# Patient Record
Sex: Female | Born: 1980
Health system: Southern US, Community
[De-identification: ages and names within clinical notes are randomized; demographics above are authoritative.]

## PROBLEM LIST (undated history)

## (undated) DIAGNOSIS — F329 Major depressive disorder, single episode, unspecified: Secondary | ICD-10-CM

## (undated) DIAGNOSIS — F319 Bipolar disorder, unspecified: Secondary | ICD-10-CM

## (undated) DIAGNOSIS — F419 Anxiety disorder, unspecified: Secondary | ICD-10-CM

## (undated) DIAGNOSIS — F32A Depression, unspecified: Secondary | ICD-10-CM

## (undated) HISTORY — DX: Major depressive disorder, single episode, unspecified: F32.9

## (undated) HISTORY — PX: CHOLECYSTECTOMY: SHX55

## (undated) HISTORY — DX: Depression, unspecified: F32.A

## (undated) HISTORY — DX: Anxiety disorder, unspecified: F41.9

## (undated) HISTORY — DX: Bipolar disorder, unspecified: F31.9

---

## 2003-02-08 ENCOUNTER — Encounter: Admission: RE | Admit: 2003-02-08 | Discharge: 2003-02-08 | Payer: Self-pay | Admitting: Obstetrics and Gynecology

## 2003-02-20 ENCOUNTER — Inpatient Hospital Stay (HOSPITAL_COMMUNITY): Admission: AD | Admit: 2003-02-20 | Discharge: 2003-02-22 | Payer: Self-pay | Admitting: Obstetrics and Gynecology

## 2003-03-01 ENCOUNTER — Inpatient Hospital Stay (HOSPITAL_COMMUNITY): Admission: AD | Admit: 2003-03-01 | Discharge: 2003-03-15 | Payer: Self-pay | Admitting: Obstetrics and Gynecology

## 2003-03-12 ENCOUNTER — Encounter (INDEPENDENT_AMBULATORY_CARE_PROVIDER_SITE_OTHER): Payer: Self-pay | Admitting: Specialist

## 2003-04-07 ENCOUNTER — Other Ambulatory Visit: Admission: RE | Admit: 2003-04-07 | Discharge: 2003-04-07 | Payer: Self-pay | Admitting: Obstetrics and Gynecology

## 2005-11-17 ENCOUNTER — Encounter: Admission: RE | Admit: 2005-11-17 | Discharge: 2005-11-17 | Payer: Self-pay | Admitting: Internal Medicine

## 2005-11-24 ENCOUNTER — Ambulatory Visit (HOSPITAL_COMMUNITY): Payer: Self-pay | Admitting: Psychiatry

## 2005-12-17 ENCOUNTER — Ambulatory Visit (HOSPITAL_COMMUNITY): Payer: Self-pay | Admitting: Psychiatry

## 2005-12-31 ENCOUNTER — Ambulatory Visit (HOSPITAL_COMMUNITY): Payer: Self-pay | Admitting: Psychiatry

## 2006-01-28 ENCOUNTER — Ambulatory Visit (HOSPITAL_COMMUNITY): Payer: Self-pay | Admitting: Psychiatry

## 2006-04-13 ENCOUNTER — Ambulatory Visit (HOSPITAL_COMMUNITY): Payer: Self-pay | Admitting: Psychiatry

## 2006-08-26 ENCOUNTER — Ambulatory Visit (HOSPITAL_COMMUNITY): Payer: Self-pay | Admitting: Psychiatry

## 2006-10-28 ENCOUNTER — Ambulatory Visit (HOSPITAL_COMMUNITY): Payer: Self-pay | Admitting: Psychiatry

## 2006-12-23 ENCOUNTER — Encounter: Admission: RE | Admit: 2006-12-23 | Discharge: 2007-03-23 | Payer: Self-pay | Admitting: Internal Medicine

## 2006-12-28 ENCOUNTER — Ambulatory Visit (HOSPITAL_COMMUNITY): Payer: Self-pay | Admitting: Psychiatry

## 2007-02-25 HISTORY — PX: LAPAROSCOPIC GASTRIC BANDING: SHX1100

## 2007-03-01 ENCOUNTER — Ambulatory Visit (HOSPITAL_COMMUNITY): Payer: Self-pay | Admitting: Psychiatry

## 2007-03-12 ENCOUNTER — Ambulatory Visit (HOSPITAL_COMMUNITY): Admission: RE | Admit: 2007-03-12 | Discharge: 2007-03-12 | Payer: Self-pay | Admitting: General Surgery

## 2007-05-03 ENCOUNTER — Ambulatory Visit (HOSPITAL_COMMUNITY): Payer: Self-pay | Admitting: Psychiatry

## 2007-06-16 ENCOUNTER — Encounter: Admission: RE | Admit: 2007-06-16 | Discharge: 2007-09-14 | Payer: Self-pay | Admitting: Internal Medicine

## 2007-07-05 ENCOUNTER — Ambulatory Visit (HOSPITAL_COMMUNITY): Admission: RE | Admit: 2007-07-05 | Discharge: 2007-07-06 | Payer: Self-pay | Admitting: General Surgery

## 2007-07-14 ENCOUNTER — Ambulatory Visit (HOSPITAL_COMMUNITY): Payer: Self-pay | Admitting: Psychiatry

## 2007-09-20 ENCOUNTER — Encounter: Admission: RE | Admit: 2007-09-20 | Discharge: 2007-11-16 | Payer: Self-pay | Admitting: Internal Medicine

## 2007-10-08 ENCOUNTER — Ambulatory Visit (HOSPITAL_COMMUNITY): Payer: Self-pay | Admitting: Psychiatry

## 2007-12-06 ENCOUNTER — Ambulatory Visit (HOSPITAL_COMMUNITY): Payer: Self-pay | Admitting: Psychiatry

## 2008-04-10 ENCOUNTER — Ambulatory Visit (HOSPITAL_COMMUNITY): Payer: Self-pay | Admitting: Psychiatry

## 2008-08-14 ENCOUNTER — Ambulatory Visit (HOSPITAL_COMMUNITY): Payer: Self-pay | Admitting: Psychiatry

## 2008-11-15 ENCOUNTER — Ambulatory Visit (HOSPITAL_COMMUNITY): Payer: Self-pay | Admitting: Psychiatry

## 2009-02-07 ENCOUNTER — Ambulatory Visit (HOSPITAL_COMMUNITY): Payer: Self-pay | Admitting: Psychiatry

## 2009-02-24 HISTORY — PX: OTHER SURGICAL HISTORY: SHX169

## 2009-05-23 ENCOUNTER — Ambulatory Visit (HOSPITAL_COMMUNITY): Payer: Self-pay | Admitting: Psychiatry

## 2009-06-24 ENCOUNTER — Emergency Department (HOSPITAL_BASED_OUTPATIENT_CLINIC_OR_DEPARTMENT_OTHER): Admission: EM | Admit: 2009-06-24 | Discharge: 2009-06-24 | Payer: Self-pay | Admitting: Emergency Medicine

## 2009-06-24 ENCOUNTER — Ambulatory Visit: Payer: Self-pay | Admitting: Diagnostic Radiology

## 2009-06-27 ENCOUNTER — Inpatient Hospital Stay (HOSPITAL_COMMUNITY): Admission: EM | Admit: 2009-06-27 | Discharge: 2009-07-01 | Payer: Self-pay | Admitting: Emergency Medicine

## 2009-08-08 ENCOUNTER — Encounter: Admission: RE | Admit: 2009-08-08 | Discharge: 2009-08-08 | Payer: Self-pay | Admitting: General Surgery

## 2009-08-08 ENCOUNTER — Ambulatory Visit (HOSPITAL_COMMUNITY): Payer: Self-pay | Admitting: Psychiatry

## 2009-08-21 ENCOUNTER — Inpatient Hospital Stay (HOSPITAL_COMMUNITY): Admission: RE | Admit: 2009-08-21 | Discharge: 2009-08-23 | Payer: Self-pay | Admitting: Surgery

## 2009-11-05 ENCOUNTER — Ambulatory Visit (HOSPITAL_COMMUNITY): Payer: Self-pay | Admitting: Psychiatry

## 2010-02-24 HISTORY — PX: GASTRIC BYPASS: SHX52

## 2010-03-25 ENCOUNTER — Ambulatory Visit (HOSPITAL_COMMUNITY)
Admission: RE | Admit: 2010-03-25 | Discharge: 2010-03-25 | Payer: Self-pay | Source: Home / Self Care | Attending: Psychiatry | Admitting: Psychiatry

## 2010-05-12 LAB — CBC
HCT: 35.2 % — ABNORMAL LOW (ref 36.0–46.0)
HCT: 36.6 % (ref 36.0–46.0)
Hemoglobin: 12 g/dL (ref 12.0–15.0)
Hemoglobin: 12.7 g/dL (ref 12.0–15.0)
MCH: 31.1 pg (ref 26.0–34.0)
MCH: 31.5 pg (ref 26.0–34.0)
MCHC: 34.1 g/dL (ref 30.0–36.0)
MCHC: 34.7 g/dL (ref 30.0–36.0)
MCV: 90.8 fL (ref 78.0–100.0)
MCV: 91 fL (ref 78.0–100.0)
Platelets: 259 10*3/uL (ref 150–400)
Platelets: 301 10*3/uL (ref 150–400)
RBC: 3.87 MIL/uL (ref 3.87–5.11)
RBC: 4.04 MIL/uL (ref 3.87–5.11)
RDW: 12.8 % (ref 11.5–15.5)
RDW: 13.1 % (ref 11.5–15.5)
WBC: 5.1 10*3/uL (ref 4.0–10.5)
WBC: 7 10*3/uL (ref 4.0–10.5)

## 2010-05-12 LAB — COMPREHENSIVE METABOLIC PANEL
ALT: 14 U/L (ref 0–35)
AST: 16 U/L (ref 0–37)
Albumin: 3.5 g/dL (ref 3.5–5.2)
Alkaline Phosphatase: 73 U/L (ref 39–117)
BUN: 8 mg/dL (ref 6–23)
CO2: 26 mEq/L (ref 19–32)
Calcium: 8.8 mg/dL (ref 8.4–10.5)
Chloride: 104 mEq/L (ref 96–112)
Creatinine, Ser: 0.74 mg/dL (ref 0.4–1.2)
GFR calc Af Amer: >60 mL/min (ref >60)
GFR calc non Af Amer: >60 mL/min (ref >60)
Glucose, Bld: 86 mg/dL (ref 70–99)
Potassium: 3.9 mEq/L (ref 3.5–5.1)
Sodium: 138 mEq/L (ref 135–145)
Total Bilirubin: 0.6 mg/dL (ref 0.3–1.2)
Total Protein: 6.7 g/dL (ref 6.0–8.3)

## 2010-05-12 LAB — DIFFERENTIAL
Basophils Absolute: 0 10*3/uL (ref 0.0–0.1)
Basophils Relative: 0 % (ref 0–1)
Eosinophils Absolute: 0.1 10*3/uL (ref 0.0–0.7)
Eosinophils Relative: 1 % (ref 0–5)
Lymphocytes Relative: 31 % (ref 12–46)
Lymphs Abs: 1.6 10*3/uL (ref 0.7–4.0)
Monocytes Absolute: 0.5 10*3/uL (ref 0.1–1.0)
Monocytes Relative: 9 % (ref 3–12)
Neutro Abs: 2.9 10*3/uL (ref 1.7–7.7)
Neutrophils Relative %: 58 % (ref 43–77)

## 2010-05-14 LAB — DIFFERENTIAL
Basophils Absolute: 0 10*3/uL (ref 0.0–0.1)
Basophils Absolute: 0 10*3/uL (ref 0.0–0.1)
Basophils Relative: 0 % (ref 0–1)
Eosinophils Absolute: 0 10*3/uL (ref 0.0–0.7)
Eosinophils Absolute: 0 10*3/uL (ref 0.0–0.7)
Eosinophils Relative: 0 % (ref 0–5)
Eosinophils Relative: 0 % (ref 0–5)
Lymphocytes Relative: 15 % (ref 12–46)
Lymphs Abs: 1.5 10*3/uL (ref 0.7–4.0)
Monocytes Absolute: 0.6 10*3/uL (ref 0.1–1.0)
Monocytes Absolute: 0.7 10*3/uL (ref 0.1–1.0)
Monocytes Relative: 6 % (ref 3–12)
Neutro Abs: 8 10*3/uL — ABNORMAL HIGH (ref 1.7–7.7)
Neutrophils Relative %: 79 % — ABNORMAL HIGH (ref 43–77)

## 2010-05-14 LAB — CBC
HCT: 31.5 % — ABNORMAL LOW (ref 36.0–46.0)
HCT: 37.4 % (ref 36.0–46.0)
HCT: 39.9 % (ref 36.0–46.0)
Hemoglobin: 11 g/dL — ABNORMAL LOW (ref 12.0–15.0)
Hemoglobin: 12.7 g/dL (ref 12.0–15.0)
Hemoglobin: 13.7 g/dL (ref 12.0–15.0)
MCHC: 34 g/dL (ref 30.0–36.0)
MCHC: 34.8 g/dL (ref 30.0–36.0)
MCHC: 34.4 g/dL (ref 30.0–36.0)
MCV: 90.5 fL (ref 78.0–100.0)
MCV: 91.3 fL (ref 78.0–100.0)
MCV: 89.1 fL (ref 78.0–100.0)
Platelets: 274 10*3/uL (ref 150–400)
Platelets: 327 10*3/uL (ref 150–400)
Platelets: 269 10*3/uL (ref 150–400)
RBC: 3.48 MIL/uL — ABNORMAL LOW (ref 3.87–5.11)
RBC: 4.1 MIL/uL (ref 3.87–5.11)
RDW: 12.6 % (ref 11.5–15.5)
RDW: 12.7 % (ref 11.5–15.5)
RDW: 12.1 % (ref 11.5–15.5)
WBC: 10.1 10*3/uL (ref 4.0–10.5)
WBC: 7.2 10*3/uL (ref 4.0–10.5)

## 2010-05-14 LAB — COMPREHENSIVE METABOLIC PANEL
ALT: 9 U/L (ref 0–35)
AST: 11 U/L (ref 0–37)
Albumin: 3.2 g/dL — ABNORMAL LOW (ref 3.5–5.2)
Alkaline Phosphatase: 89 U/L (ref 39–117)
BUN: 7 mg/dL (ref 6–23)
CO2: 26 mEq/L (ref 19–32)
Calcium: 9.2 mg/dL (ref 8.4–10.5)
Chloride: 101 mEq/L (ref 96–112)
Creatinine, Ser: 0.63 mg/dL (ref 0.4–1.2)
GFR calc Af Amer: >60 mL/min (ref >60)
GFR calc non Af Amer: >60 mL/min (ref >60)
Glucose, Bld: 90 mg/dL (ref 70–99)
Potassium: 3.5 mEq/L (ref 3.5–5.1)
Sodium: 136 mEq/L (ref 135–145)
Total Bilirubin: 0.4 mg/dL (ref 0.3–1.2)
Total Protein: 7.2 g/dL (ref 6.0–8.3)

## 2010-05-14 LAB — ANAEROBIC CULTURE

## 2010-05-14 LAB — BASIC METABOLIC PANEL
BUN: 9 mg/dL (ref 6–23)
CO2: 25 mEq/L (ref 19–32)
Chloride: 104 mEq/L (ref 96–112)
Glucose, Bld: 93 mg/dL (ref 70–99)
Potassium: 4.5 mEq/L (ref 3.5–5.1)
Sodium: 141 mEq/L (ref 135–145)

## 2010-05-14 LAB — VANCOMYCIN, TROUGH: Vancomycin Tr: 10.7 ug/mL (ref 10.0–20.0)

## 2010-05-14 LAB — CULTURE, ROUTINE-ABSCESS: Culture: NO GROWTH

## 2010-05-14 LAB — URINALYSIS, ROUTINE W REFLEX MICROSCOPIC
Bilirubin Urine: NEGATIVE
Glucose, UA: NEGATIVE mg/dL
Hgb urine dipstick: NEGATIVE
Protein, ur: NEGATIVE mg/dL
Urobilinogen, UA: 1 mg/dL (ref 0.0–1.0)

## 2010-05-14 LAB — CREATININE, SERUM
Creatinine, Ser: 0.49 mg/dL (ref 0.4–1.2)
GFR calc Af Amer: >60 mL/min (ref >60)
GFR calc non Af Amer: >60 mL/min (ref >60)

## 2010-05-14 LAB — URINE MICROSCOPIC-ADD ON

## 2010-05-14 LAB — D-DIMER, QUANTITATIVE: D-Dimer, Quant: 0.66 ug/mL-FEU — ABNORMAL HIGH (ref 0.00–0.48)

## 2010-05-22 ENCOUNTER — Other Ambulatory Visit (HOSPITAL_COMMUNITY): Payer: Self-pay | Admitting: General Surgery

## 2010-06-07 ENCOUNTER — Encounter (HOSPITAL_COMMUNITY): Payer: Managed Care, Other (non HMO) | Admitting: Psychiatry

## 2010-06-07 DIAGNOSIS — F3189 Other bipolar disorder: Secondary | ICD-10-CM

## 2010-06-10 ENCOUNTER — Ambulatory Visit (HOSPITAL_COMMUNITY)
Admission: RE | Admit: 2010-06-10 | Discharge: 2010-06-10 | Disposition: A | Discharge status: Home / Self Care | Payer: Managed Care, Other (non HMO) | Source: Ambulatory Visit | Attending: General Surgery | Admitting: General Surgery

## 2010-06-10 DIAGNOSIS — K8020 Calculus of gallbladder without cholecystitis without obstruction: Secondary | ICD-10-CM | POA: Insufficient documentation

## 2010-06-10 DIAGNOSIS — Z01818 Encounter for other preprocedural examination: Secondary | ICD-10-CM | POA: Insufficient documentation

## 2010-06-11 ENCOUNTER — Ambulatory Visit: Payer: Managed Care, Other (non HMO) | Admitting: *Deleted

## 2010-06-11 ENCOUNTER — Ambulatory Visit (HOSPITAL_COMMUNITY)
Admission: RE | Admit: 2010-06-11 | Discharge: 2010-06-11 | Disposition: A | Discharge status: Home / Self Care | Payer: Managed Care, Other (non HMO) | Source: Ambulatory Visit | Attending: General Surgery | Admitting: General Surgery

## 2010-06-12 ENCOUNTER — Encounter: Payer: Managed Care, Other (non HMO) | Attending: General Surgery | Admitting: *Deleted

## 2010-06-12 DIAGNOSIS — Z09 Encounter for follow-up examination after completed treatment for conditions other than malignant neoplasm: Secondary | ICD-10-CM | POA: Insufficient documentation

## 2010-06-12 DIAGNOSIS — Z9884 Bariatric surgery status: Secondary | ICD-10-CM | POA: Insufficient documentation

## 2010-06-12 DIAGNOSIS — Z713 Dietary counseling and surveillance: Secondary | ICD-10-CM | POA: Insufficient documentation

## 2010-06-17 ENCOUNTER — Encounter (HOSPITAL_COMMUNITY): Payer: Self-pay | Admitting: Psychiatry

## 2010-06-27 ENCOUNTER — Encounter: Payer: Managed Care, Other (non HMO) | Attending: General Surgery

## 2010-06-27 DIAGNOSIS — Z09 Encounter for follow-up examination after completed treatment for conditions other than malignant neoplasm: Secondary | ICD-10-CM | POA: Insufficient documentation

## 2010-06-27 DIAGNOSIS — Z713 Dietary counseling and surveillance: Secondary | ICD-10-CM | POA: Insufficient documentation

## 2010-06-27 DIAGNOSIS — Z9884 Bariatric surgery status: Secondary | ICD-10-CM | POA: Insufficient documentation

## 2010-07-05 ENCOUNTER — Other Ambulatory Visit: Payer: Self-pay | Admitting: General Surgery

## 2010-07-05 ENCOUNTER — Encounter (HOSPITAL_COMMUNITY): Payer: Managed Care, Other (non HMO) | Attending: General Surgery

## 2010-07-05 DIAGNOSIS — Z01811 Encounter for preprocedural respiratory examination: Secondary | ICD-10-CM | POA: Insufficient documentation

## 2010-07-05 DIAGNOSIS — Z9884 Bariatric surgery status: Secondary | ICD-10-CM | POA: Insufficient documentation

## 2010-07-05 DIAGNOSIS — K8020 Calculus of gallbladder without cholecystitis without obstruction: Secondary | ICD-10-CM | POA: Insufficient documentation

## 2010-07-05 DIAGNOSIS — Z79899 Other long term (current) drug therapy: Secondary | ICD-10-CM | POA: Insufficient documentation

## 2010-07-05 DIAGNOSIS — Z01812 Encounter for preprocedural laboratory examination: Secondary | ICD-10-CM | POA: Insufficient documentation

## 2010-07-05 LAB — HCG, SERUM, QUALITATIVE: Preg, Serum: NEGATIVE

## 2010-07-05 LAB — COMPREHENSIVE METABOLIC PANEL
ALT: 10 U/L (ref 0–35)
AST: 16 U/L (ref 0–37)
Albumin: 3.5 g/dL (ref 3.5–5.2)
Alkaline Phosphatase: 80 U/L (ref 39–117)
Chloride: 102 mEq/L (ref 96–112)
GFR calc Af Amer: >60 mL/min (ref >60)
Potassium: 4 mEq/L (ref 3.5–5.1)
Sodium: 137 mEq/L (ref 135–145)
Total Bilirubin: 0.3 mg/dL (ref 0.3–1.2)
Total Protein: 7.2 g/dL (ref 6.0–8.3)

## 2010-07-05 LAB — CBC
HCT: 39.2 % (ref 36.0–46.0)
MCV: 87.7 fL (ref 78.0–100.0)
Platelets: 361 10*3/uL (ref 150–400)
RBC: 4.47 MIL/uL (ref 3.87–5.11)
RDW: 12.5 % (ref 11.5–15.5)
WBC: 7.6 10*3/uL (ref 4.0–10.5)

## 2010-07-05 LAB — DIFFERENTIAL
Basophils Absolute: 0 10*3/uL (ref 0.0–0.1)
Lymphocytes Relative: 30 % (ref 12–46)
Lymphs Abs: 2.3 10*3/uL (ref 0.7–4.0)
Neutro Abs: 4.4 10*3/uL (ref 1.7–7.7)

## 2010-07-05 LAB — SURGICAL PCR SCREEN: MRSA, PCR: NEGATIVE

## 2010-07-09 NOTE — Op Note (Signed)
[name], [name]            ACCOUNT NO.:  [id number]   MEDICAL RECORD NO.:  [id number]          PATIENT TYPE:  OIB   LOCATION:  0098                         FACILITY:  [facility]   PHYSICIAN:  Sharlet Salina T. Hoxworth, M.D.DATE OF BIRTH:  06/23/1980   DATE OF PROCEDURE:  07/05/2007  DATE OF DISCHARGE:                               OPERATIVE REPORT   PRE AND POSTOPERATIVE DIAGNOSIS:  Morbid obesity.   SURGICAL PROCEDURES:  Placement of laparoscopic adjustable gastric band.   SURGEON:  Lorne Skeens. Hoxworth, M.D.   ASSISTANT:  Sandria Bales. Ezzard Standing, M.D.   ANESTHESIA:  General.   BRIEF HISTORY:  [name] is a 30 year old female with  progressive morbid obesity unresponsive to medical management.  Following an extensive preoperative workup and discussion detailed  elsewhere, we have elected to proceed with placement laparoscopic  adjustable gastric band.   DESCRIPTION OF PROCEDURE:  The patient was brought to the operating room  and placed in supine position on the operating table and general  orotracheal anesthesia was induced.  She received preoperative IV  antibiotics.  PAS were placed.  Subcutaneous heparin had been  administered preoperatively.  The abdomen was widely sterilely prepped  and draped and correct patient and procedure were verified.  Local  anesthesia was used to infiltrate the trocar sites.  Through a 1 cm  incision in the left subcostal space, access was obtained with an 11 mm  OptiVu trocar without difficulty and pneumoperitoneum established.  Under direct vision, a 15 mm trocar was placed in the right upper  abdomen laterally, an 11 mm trocar in the right upper mid abdomen and an  11 mm trocar just to  the left and above the umbilicus for the camera  port.  Through the subxiphoid site a 5 mm Nathanson's retractor was  placed and the left lobe liver elevated with exposure of the hiatus and  upper stomach.  The angle of His was exposed and peritoneum opened  over  the left crus and careful blunt dissection carried back down toward the  retrogastric space.  The pars flaccida was then opened along the  avascular area and the base of the right crus identified.  The  peritoneum was incised in the area of crossing fat at the base of the  right crus and then the finger dissector was able to be passed easily  behind the stomach and deployed up through the previously dissected area  in the angle of His.  A flushed AP Standard lap band system was  introduced in the abdomen.  The tubing passed into the finger dissector  and then brought back behind the stomach.  The band was brought back  behind the stomach without difficulty.  A sizing tube was placed orally  into the stomach and then the band snapped into place without any undue  tension, the sizing tube removed.  Holding the tubing toward the  patient's feet, the fundus was imbricated up over the band to the small  gastric pouch was several interrupted 2-0 Ethilon sutures.  Band  appeared in excellent position.  There is no bleeding.  The tubing  was  brought out through the right mid abdominal site and the trocars removed  and all CO2 evacuated.  This incision was lengthened somewhat and the  anterior fascia exposed.  Four 2-0 Prolene sutures were placed into the  fascia and then the tubing was cut, attached the port and the port  attached to the anterior fascia with previously placed sutures.  The  tubing was seen to curve smoothly into the abdominal cavity.  The port  site was  closed with running subcutaneous 2-0 Vicryl and subcuticular 4-0  Monocryl and the other incisions closed with subcuticular 4-0 Monocryl.  All skin was closed with Dermabond.  Sponge, needle, instrument counts  correct.  The patient was taken to recovery in good position.      Lorne Skeens. Hoxworth, M.D.  Electronically Signed     BTH/MEDQ  D:  07/05/2007  T:  07/05/2007  Job:  [id number]

## 2010-07-12 NOTE — H&P (Signed)
[name], [name]                        ACCOUNT NO.:  [id number]   MEDICAL RECORD NO.:  [id number]                   PATIENT TYPE:  MAT   LOCATION:  MATC                                 FACILITY:  [facility]   PHYSICIAN:  Richardean Sale, M.D.                DATE OF BIRTH:  Jul 10, 1980   DATE OF ADMISSION:  02/20/2003  DATE OF DISCHARGE:                                HISTORY & PHYSICAL   ADMISSION DIAGNOSES:  1. 34-week pregnancy.  2. Elevated blood pressures.   HISTORY OF PRESENT ILLNESS:  This is a 30 year old gravida 1, para 0, at 34  weeks and 4 days with a due date of March 30, 2003, who was seen in the  office today with complaints of decreased fetal movement and found to have  an elevated blood pressure of 158/110 in the office, also with complaints of  headache and blurred vision.  The patient has been followed for transient  elevated blood pressures in the office.  She underwent a 24-hour urine  collection over the weekend which was within normal limits at 135 mg  protein.  The patient denied any abdominal pain.  She did report decreased  fetal movement, some back pain, but no contractions, no fluid and no vaginal  bleeding.  On admission at the maternity admission unit, it was noted that  blood pressures were slightly improved at 150/90, but she was still  complaining of a headache at a rate of 8 out of 10.  She was given Tylenol  for headache which did improve somewhat.  She was still complaining of  transient blurred vision, but overall was better after receiving Tylenol.  Her blood pressure came down to 130 to 150 over 80 to 90.  Fetal heart rate  tracing was reactive.  Good accelerations were noted and only occasional  contractions.  Preeclampsia labs drawn in the maternity admission unit were  normal, but the patient continued to complain of headache, therefore, she is  being admitted for observation for elevated blood pressures, headache, and  to rule out  preeclampsia.   PAST MEDICAL HISTORY:  No hospitalization.   PAST SURGICAL HISTORY:  Tonsillectomy in 1997, wisdom teeth in 1996.   PAST OBSTETRICAL HISTORY:  Gravida 1, para 0.  Gynecologic; history of  abnormal Pap smear.  Positive HPV status post cryotherapy.   FAMILY HISTORY:  Denies any coagulopathies or inherited anomalies.   SOCIAL HISTORY:  Denies tobacco, alcohol, or drug use.   PRENATAL LABORATORY DATA:  Blood type A positive, antibody screen negative,  RPR nonreactive, rubella immune, hepatitis B surface antigen negative, HIV  nonreactive, Pap within normal limits.  GC and Chlamydia negative.  Triple  test normal.   PHYSICAL EXAMINATION:  VITAL SIGNS:  Blood pressure 130 to 150 over 80 to  90, heart rate is normal, she is afebrile.  GENERAL:  She is a well-developed, well-nourished, white female who is in no  acute distress.  ABDOMEN:  Gravid, soft, and nontender.  Fundus appears AGA and nontender.  EXTREMITIES:  There is 1+ edema bilaterally in the feet that is nontender.  There is trace edema in her hands.  Deep tendon reflexes are 3+ bilaterally  with no clonus.  NEUROLOGY:  Overall nonfocal.  PELVIC:  Cervix in the office was closed, thick, and high.   Ultrasound was performed for fetal weight and fluid.  Estimated fetal weight  was 3600 to 3700 grams which is greater than 95th percentile for 35 weeks.  Amniotic fluid volume was 14 cm which is within normal limits.  Infant is  cephalic.  Placenta is anterior with no previa.  Translabial cervical length  was 3.3 cm.  Uric acid was 5.3, LDH 132, AST 13, ALT less than 19, BUN 7,  creatinine 0.6, platelets 249, hemoglobin 12.1, white blood cell count 9.3.   ASSESSMENT:  A 30 year old gravida 1, para 0, at 34-4/[redacted] weeks gestation with  elevated blood pressures and headache.  Headache now improved, status post  Tylenol.   PLAN:  1. Will admit for observation with serial blood pressures and repeat     preeclampsia  labs in six hours.  2. Hospital bed rest.  3. If blood pressures increase or the patient's headache recurs, will start     magnesium sulfate for seizure prophylaxis.  4. Continue with fetal monitoring.  5. Repeat 24-hour urine.  6. Monitor symptoms closely.                                               Richardean Sale, M.D.    JW/MEDQ  D:  02/20/2003  T:  02/20/2003  Job:  [id number]

## 2010-07-12 NOTE — Discharge Summary (Signed)
NAMEKESSA, FAIRBAIRN                        ACCOUNT NO.:  [id number]   MEDICAL RECORD NO.:  [id number]                   PATIENT TYPE:  INP   LOCATION:  9116                                 FACILITY:  [facility]   PHYSICIAN:  Genia Del, M.D.             DATE OF BIRTH:  [birth year]   DATE OF ADMISSION:  03/01/2003  DATE OF DISCHARGE:  03/15/2003                                 DISCHARGE SUMMARY   ADMISSION DIAGNOSIS:  At 36 weeks with labile hypertension.   DISCHARGE DIAGNOSES:  1. At 36 weeks with labile hypertension.  2. Oligohydramnios.  3. Failure to progress in labor.   PROCEDURE:  Urgent low transverse cesarean section on March 12, 2003.  Birth of baby boy, Apgars 8 and 8, weight 8 pounds 12 ounces.   HOSPITAL COURSE:  The patient was induced on March 10, 2003, at 37+ weeks  because of labile hypertension and mild oligohydramnios.  Cervidil was done  first and then she had low dose Pitocin.  She showed no progression further  than 2 cm 90%, vertex -2 and the decision was made to proceed with urgent C-  section.  An urgent low transverse cesarean section was done.  The  hysterotomy was closed in two layers.  The estimated blood loss was 700 mL.  No complications occurred.   The postoperative evolution was unremarkable.  She stayed hemodynamically  stable and afebrile.  Her hemoglobin was 9.2 postoperatively with hematocrit  of 26.1.  She was discharged home on postoperative day #2, in good condition  with a blood pressure of 132/80.  Tylox and Motrin were prescribed p.r.n.  for pain.  She will follow up at the office for blood pressure check in one  week.                                               Genia Del, M.D.    ML/MEDQ  D:  05/01/2003  T:  05/02/2003  Job:  [id number]

## 2010-07-12 NOTE — Op Note (Signed)
[name], [name]                        ACCOUNT NO.:  [id number]   MEDICAL RECORD NO.:  [id number]                   PATIENT TYPE:  INP   LOCATION:  9116                                 FACILITY:  [facility]   PHYSICIAN:  Genia Del, M.D.             DATE OF BIRTH:  [birth year]   DATE OF PROCEDURE:  03/12/2003  DATE OF DISCHARGE:                                 OPERATIVE REPORT   PREOPERATIVE DIAGNOSES:  1. Thirty-seven plus weeks' gestation.  2. Labile pregnancy-induced hypertension.  3. Oligohydramnios.  4. Failure to progress.   POSTOPERATIVE DIAGNOSES:  1. Thirty-seven plus weeks' gestation.  2. Labile pregnancy-induced hypertension.  3. Oligohydramnios.  4. Failure to progress.   INTERVENTION:  Urgent low transverse C-section.   SURGEON:  Genia Del, M.D.   ANESTHESIOLOGIST:  Dr. Tacy Dura.   DESCRIPTION OF PROCEDURE:  Under spinal anesthesia, the patient is in the  left decubitus position 15 degrees.  She is prepped with Betadine on the  abdomen, suprapubic, vulvar, and vaginal area.  The bladder catheter is  inserted, and the patient is draped as usual.  We verified the level of  anesthesia.  It is adequate.  We make a Pfannenstiel incision with a  scalpel.  We opened the aponeurosis transversely with the Mayo scissors and  detached the recti muscles from the aponeurosis on the midline superiorly  and inferiorly.  The parietal peritoneum is opened longitudinally with the  Metzenbaum scissors.  We then put the bladder retractor in place.  The  visceral peritoneum is opened transversely with the [facility]. scissors over the  lower uterine segment, and the bladder is reclined downward.  The bladder  retractor is repositioned.  We then open the hysterotomy transversely over  the lower uterine segment.  The amniotic fluid is clear.  The fetus is in  cephalic presentation high in the pelvis.  Birth of a baby boy at 5:36.  The  baby is suctioned after delivery of the  head.  We then clamp and cut the  cord.  The baby is given to the neonatal team.  Apgars are 8 and 8.  Weight  is 8 pounds 12 ounces.  The uterus contracts well with Pitocin.  Ancef 1 g  is given IV.  We close the hysterotomy with a first locked running suture of  Vicryl 0.  A second plane of Vicryl 0 in a mattress fashion is done, and we  complete hemostasis with two X stitches over the right angle.  Hemostasis is  adequate.  We look at the tubes and ovaries.  Those are normal in size and  appearance bilaterally.  The uterus is well-contracted.  We irrigate and  suction the abdominopelvic cavity, remove any blood clots.  We then verify  hemostasis on the bladder flap and on the recti muscles and aponeurosis.  It  is completed with the electrocautery where necessary.  We then close the  aponeurosis with  two half running sutures of Vicryl 0.  The count of  instruments and sponges is compete x 2.  We complete hemostasis on the  adipose tissue with the electrocautery and  close the skin with staples.  The subcutaneous tissue is then infiltrated  with Marcaine 0.25% 10 mL, and a dry dressing is applied.  The estimated  blood loss was 700 mL.  No complication occurred, and the patient was  brought to recovery room in good status.                                               Genia Del, M.D.    ML/MEDQ  D:  03/12/2003  T:  03/12/2003  Job:  [id number]

## 2010-07-12 NOTE — H&P (Signed)
[name], [name]                        ACCOUNT NO.:  [id number]   MEDICAL RECORD NO.:  [id number]                   PATIENT TYPE:  MAT   LOCATION:  MATC                                 FACILITY[facility]   PHYSICIAN:  Richardean Sale, M.D.                DATE OF BIRTH:  Jan 07, 1981   DATE OF ADMISSION:  03/01/2003  DATE OF DISCHARGE:                                HISTORY & PHYSICAL   ADMISSION DIAGNOSIS:  36-week pregnancy with elevated blood pressure.   HISTORY OF PRESENT ILLNESS:  This is a 30 year old gravida 1, para 0, white  female who was seen in the office today and noted to have a blood pressure  of 160/104.  The patient denied any headaches, visual changes, or epigastric  pain.  She has been having trouble with intermittent blood pressure  elevations throughout the pregnancy and was admitted at 34 weeks for  hospital bed rest with monitoring.  Blood pressures were initially elevated  as high as 150/110.  They returned to normal after hospital bed rest.  Her  laboratory evaluation was all within normal limits and her 24-hour urine  protein was within normal limits as well.  The patient underwent ultrasound  evaluation in the office today which revealed a single intrauterine  pregnancy in the vertex presentation measuring large for gestational age at  7 pounds 11 ounces for 35-1/2 weeks.  Cervical examination per Dr. Cherly Hensen  was closed, thick, and high in the office.  The patient presents for  hospital bed rest, serial laboratory evaluation, and fetal monitoring to  rule out preeclampsia.  She also denies any loss of fluid, vaginal bleeding,  and reports irregular Braxton-Hicks contractions and reports good fetal  movement.   PAST OBSTETRICAL HISTORY:  Gravida 0.  GYN; positive history of abnormal Pap  smear status post cryotherapy.  Pap smears within normal limits since.  No  history of sexually transmitted diseases.   PAST MEDICAL HISTORY:  None.   PAST SURGICAL  HISTORY:  Cryotherapy as above.  Tonsillectomy in 1997.  Wisdom teeth extracted in 1996.   FAMILY HISTORY:  Mother with skin cancer.  Maternal grandmother with skin  cancer.  No congenital anomalies, birth defects, or inherited illnesses to  the patient's knowledge.   SOCIAL HISTORY:  Quit tobacco.  Denies alcohol, denies cocaine or any other  drug use.   PHYSICAL EXAMINATION:  VITAL SIGNS:  Blood pressure 130/80 presently. She is  afebrile.  Temperature 97.6, respirations 20, heart rate 91.  Fetal heart  rate tracing baseline is 140 with positive accelerations, no decelerations  noted.  Tocometer; irregular contractions every two to three minutes.  No  all perceived by the patient.  GENERAL:  She is a well-developed, well-nourished, white female in no acute  distress.  HEART:  Regular rate and rhythm.  LUNGS:  Clear to auscultation bilaterally.  HEART:  Normocephalic and atraumatic.  NEUROLOGY:  Nonfocal with  2 to 3+ deep tendon reflexes bilaterally. No  clonus bilaterally.  ABDOMEN:  Soft, gravid, and nontender.  No masses palpable.  PELVIC:  Cervix closed, thick, and high in the office earlier today.  EXTREMITIES:  Trace edema bilaterally.  Nontender.  No cyanosis or clubbing.   Ultrasound from today is listed above.   PRENATAL LABORATORY DATA:  Blood type is A positive, antibody screen  negative, RPR nonreactive, rubella immune, hepatitis B surface antigen  negative, HIV nonreactive, triple test within normal limits.  Pap smear  within normal limits.  Gonorrhea and Chlamydia were negative.  One-hour  Glucola 123, Group B Strep pending.   ASSESSMENT:  A 30 year old gravida 1, para 0, at 35-6/[redacted] weeks gestation with  elevated blood pressures.   PLAN:  1. Admit to antenatal for serial blood pressures, preeclampsia labs, and     fetal monitoring.  2. Recommend proceeding with amniocentesis for fetal lung maturity as this     is the patient's second hospital admission for  elevated blood pressures     and she is now almost 36 weeks.  If FLM is mature, we will likely proceed     with two-stage induction given unfavorable cervix.  Discussed the     possibility of cesarean section secondary to potentially large for     gestational age baby based on ultrasound as well as unfavorable cervix     and the patient is in agreement to this.  We discussed the risks of     amniocentesis with a complication rate of 1 in 200 necessitating either     induction of labor from rupture of membranes or immediate delivery     secondary to bleeding, abruption, or fetal distress.  The patient is in     agreement with this and desires to proceed.  We will proceed with     amniocentesis in the morning.  3. Repeat preeclampsia labs in the morning and start 24-hour urine.  4. Fetal well-being reassuring.                                               Richardean Sale, M.D.    JW/MEDQ  D:  03/01/2003  T:  03/01/2003  Job:  [id number]

## 2010-07-15 ENCOUNTER — Other Ambulatory Visit: Payer: Self-pay | Admitting: General Surgery

## 2010-07-15 ENCOUNTER — Inpatient Hospital Stay (HOSPITAL_COMMUNITY): Payer: Managed Care, Other (non HMO)

## 2010-07-15 ENCOUNTER — Inpatient Hospital Stay (HOSPITAL_COMMUNITY)
Admission: RE | Admit: 2010-07-15 | Discharge: 2010-07-17 | DRG: 621 | Disposition: A | Discharge status: Home / Self Care | Payer: Managed Care, Other (non HMO) | Source: Ambulatory Visit | Attending: General Surgery | Admitting: General Surgery

## 2010-07-15 DIAGNOSIS — Z01812 Encounter for preprocedural laboratory examination: Secondary | ICD-10-CM

## 2010-07-15 DIAGNOSIS — K8020 Calculus of gallbladder without cholecystitis without obstruction: Secondary | ICD-10-CM | POA: Diagnosis present

## 2010-07-15 DIAGNOSIS — E7800 Pure hypercholesterolemia, unspecified: Secondary | ICD-10-CM | POA: Diagnosis present

## 2010-07-15 DIAGNOSIS — I10 Essential (primary) hypertension: Secondary | ICD-10-CM | POA: Diagnosis present

## 2010-07-15 DIAGNOSIS — Z6839 Body mass index (BMI) 39.0-39.9, adult: Secondary | ICD-10-CM

## 2010-07-15 DIAGNOSIS — F329 Major depressive disorder, single episode, unspecified: Secondary | ICD-10-CM | POA: Diagnosis present

## 2010-07-15 DIAGNOSIS — F3289 Other specified depressive episodes: Secondary | ICD-10-CM | POA: Diagnosis present

## 2010-07-15 DIAGNOSIS — Z79899 Other long term (current) drug therapy: Secondary | ICD-10-CM

## 2010-07-16 ENCOUNTER — Inpatient Hospital Stay (HOSPITAL_COMMUNITY): Payer: Managed Care, Other (non HMO)

## 2010-07-16 DIAGNOSIS — Z09 Encounter for follow-up examination after completed treatment for conditions other than malignant neoplasm: Secondary | ICD-10-CM

## 2010-07-16 LAB — DIFFERENTIAL
Basophils Relative: 0 % (ref 0–1)
Lymphocytes Relative: 13 % (ref 12–46)
Lymphs Abs: 1.4 10*3/uL (ref 0.7–4.0)
Monocytes Relative: 8 % (ref 3–12)
Neutro Abs: 8.7 10*3/uL — ABNORMAL HIGH (ref 1.7–7.7)
Neutrophils Relative %: 79 % — ABNORMAL HIGH (ref 43–77)

## 2010-07-16 LAB — CBC
HCT: 35.9 % — ABNORMAL LOW (ref 36.0–46.0)
Hemoglobin: 12.3 g/dL (ref 12.0–15.0)
MCH: 30 pg (ref 26.0–34.0)
RBC: 4.1 MIL/uL (ref 3.87–5.11)

## 2010-07-16 LAB — HEMOGLOBIN AND HEMATOCRIT, BLOOD: Hemoglobin: 12 g/dL (ref 12.0–15.0)

## 2010-07-16 NOTE — Op Note (Signed)
  NAMEHEAVEN, MEEKER            ACCOUNT NO.:  [id number]  MEDICAL RECORD NO.:  [id number]           PATIENT TYPE:  O  LOCATION:  PADM                         FACILITY:  [facility]  PHYSICIAN:  Sandria Bales. Ezzard Standing, M.D.  DATE OF BIRTH:  [birth year]  DATE OF PROCEDURE:  07/05/2010                              OPERATIVE REPORT   PREOPERATIVE DIAGNOSIS:  Status post Roux-en-Y gastric bypass (history of explanted lap band).  POSTPROCEDURE DIAGNOSES:  Roux-en-Y gastric bypass (history of explanted lap band), normal gastric pouch with gastrojejunal anastomosis.  PROCEDURES:  Esophagogastroduodenoscopy.  SURGEON:  Sandria Bales. Ezzard Standing, M.D.  FIRST ASSISTANT:  None.  ANESTHESIA:  General endotracheal.  ESTIMATED BLOOD LOSS:  Minimal.  INDICATION FOR PROCEDURE:  Ms. Reiger is a 30 year old female, who had a lap band explanted by Dr. Jaclynn Guarneri because of an erosion.  She now comes back after about a year for conversion to a Roux-en-Y gastric bypass.  Dr. Johna Sheriff has completed the Roux-en-Y gastric bypass and I am doing the upper endoscopy to evaluate the gastric pouch and the anastomosis.  PROCEDURE NOTE:  The patient in a supine position.  She is intubated and Dr. Johna Sheriff is manning the laparoscope and has flooded in the upper abdomen saline.  I passed the flexible Olympus endoscope down the back of her throat through her esophagus to the GE junction, which is 38-39 cm from the incisors.  She has a pouch with a GJ anastomosis at about 43-44 cm and about 5 cm length pouch with a normal-appearing anastomosis.  I insuflated the stomach and Dr. Johna Sheriff tested the anastomosis under water.  There were no bubbles or leaks.  The gastrojejunal anastomosis was normal and patent.  The gastric mucosa had no bleeding and appeared normal.  The staple lines were intact.  The scope was then withdrawn into the esophagus, which was unremarkable.    The patient on the procedure well and Dr. Johna Sheriff  will dictate the remainder of the operation dictation on Ms. Glade Stanford.     Sandria Bales. Ezzard Standing, M.D.     DHN/MEDQ  D:  07/15/2010  T:  07/15/2010  Job:  [id number]  cc:   Lorne Skeens. Hoxworth, M.D. [address], [address] [place] [address]  Electronically Signed by Ovidio Kin M.D. on 07/16/2010 10:39:55 AM

## 2010-07-17 LAB — CBC
HCT: 35.1 % — ABNORMAL LOW (ref 36.0–46.0)
Hemoglobin: 12.1 g/dL (ref 12.0–15.0)
MCHC: 34.5 g/dL (ref 30.0–36.0)
WBC: 7.8 10*3/uL (ref 4.0–10.5)

## 2010-07-17 LAB — DIFFERENTIAL
Basophils Absolute: 0 10*3/uL (ref 0.0–0.1)
Lymphocytes Relative: 16 % (ref 12–46)
Lymphs Abs: 1.2 10*3/uL (ref 0.7–4.0)
Monocytes Absolute: 0.7 10*3/uL (ref 0.1–1.0)
Neutro Abs: 5.8 10*3/uL (ref 1.7–7.7)

## 2010-07-23 NOTE — Op Note (Signed)
NAMEGLENNDA, [name]            ACCOUNT NO.:  [id number]  MEDICAL RECORD NO.:  [id number]           PATIENT TYPE:  I  LOCATION:  0001                         FACILITY:  [facility]  PHYSICIAN:  Sharlet Salina T. Roan Miklos, M.D.DATE OF BIRTH:  Sep 30, 1980  DATE OF PROCEDURE:  07/15/2010 DATE OF DISCHARGE:                              OPERATIVE REPORT   PREOPERATIVE DIAGNOSES: 1. Morbid obesity. 2. Cholelithiasis.  POSTOPERATIVE DIAGNOSES: 1. Morbid obesity. 2. Cholelithiasis.  SURGICAL PROCEDURES:  Laparoscopic Roux-en-Y gastric bypass. Laparoscopic cholecystectomy with intraoperative cholangiogram.  SURGEON:  Sharlet Salina T. Domenico Achord, MD  ASSISTANT:  Sandria Bales. Ezzard Standing, MD  ANESTHESIA:  General.  BRIEF HISTORY:  Sharlet Notaro is a 30 year old female with a history of a Lap-Band placement in 2009.  She had comorbidities of hypertension and elevated cholesterol.  At that time she presented with a BMI of 39.5, 5 feet 1 inch, 208 pounds.  She had initially very good success with her Lap-Band with excellent weight loss of about 60 pounds, getting her BMI down in the 20s.  She, however, suffered an erosion of her Lap- Band and had to have this removed approximately a year ago.  She has regained weight up to a BMI of 37 at about 200 pounds.  We discussed options at this point and she strongly desires weight loss surgery to try to reattain her success.  After extensive discussion of options and risks detailed elsewhere, we have elected to proceed with a laparoscopic Roux-en-Y gastric bypass.  As part of her workup, a gallbladder ultrasound has shown cholelithiasis and I have recommended cholecystectomy with cholangiogram at the time of her surgery.  She is now brought to the operating room for these procedures.  DESCRIPTION OF OPERATION:  The patient was brought to the operating room, placed in the supine position on the operating table and general endotracheal anesthesia was induced.  She  had received preoperative IV antibiotics and subcutaneous heparin.  She did undergo a bowel prep. The abdomen was widely sterilely prepped and draped.  The correct patient and procedure were verified.  PAS were placed.  Access was obtained in the left subcostal space with an 11-mm Opti-Vu trocar using a previous incision and the pneumoperitoneum was established without difficult.  The previous Lap-Band incisions were used and anesthetized. Under direct vision a 12-mm trocar was placed laterally in the right upper quadrant, the right upper quadrant midclavicular line, above and to the left of the umbilicus for the camera port.  Through a 5-mm subxiphoid site, a Nathanson retractor was placed.  I initially approached the stomach to make sure we could create the pouch without difficulty due to her previously eroded Lap-Band.  An additional 5-mm trocar was placed laterally in the left upper quadrant.  Adhesions were taken down between the left lobe of the liver and the upper stomach where her band had previously been and these were fortunately filmy and came down nicely.  We then had good exposure of the upper stomach.  We could see the previous plication sutures.  There were some adhesions of omentum up to this area that were taken down with the harmonic scalpel. It appeared  that the plication had come down a good degree on its own or at the time of the band removal.  We did take down one small area of plication anteriorly at the proximal stomach.  The angle of His was mobilized and there was some scarring here, but it was not severe and I dissected down onto the left crus and then carried careful blunt dissection back to the retrogastric space.  At this point, a measurement was made along the lesser curve in preparation for the pouch of about 4 to 5 cm.  This got [place] down just below the area of scarring and thickening from the previous Lap-Band.  Careful dissection was carried up along the  lesser curve toward the lesser sac.  We stayed on the gastric wall.  There was a little bit of thickening and slight scarring, but as we got posterior, the planes were soft and normal and the lesser sac was entered without difficulty.  Following this, an initial firing of the green load 60-mm powered echelon was performed at right angles to the lesser curve about 4 cm across the stomach.  We then had excellent exposure of the lesser curve and dissected a few adhesions posteriorly up toward the angle of His.  With an Ewald tube in place protecting the EG junction, two further firings of the 60-mm green load stapler were used up to the angle of His.  A final firing of the blue 45-mm stapler was used right at the angle of His, again with the Ewald tube protecting the pouch.  This created a nice tubular pouch about 4 to 5 cm in length. At this point, we then went down and created the Roux limb.  There were some omental adhesions down to the pelvis from previous GYN surgery and these were taken down with the harmonic scalpel.  The transverse colon was elevated and the ligament of Treitz clearly identified.  A 40-cm biliopancreatic limb was carefully measured and the small intestine was divided at this point with a single firing of the white load 45-mm stapler.  The mesentery was divided a slight distance further with the harmonic scalpel.  A Penrose drain was sutured to the end of the Roux limb for later identification and a 100-cm Roux limb was carefully measured.  At this point a side-to-side anastomosis was created between the Roux limb and the biliopancreatic limb near its end.  This was done with a single firing of the white load 45-mm stapler through enterotomies made with the harmonic scalpel.  The common enterotomy was then closed with running 2-0 Vicryl starting either end and tied centrally.  The mesenteric defect was exposed and was closed with a running 2-0 Vicryl suture, completing  this with an obstruction stitch between the Roux limb and the end of the biliopancreatic limb.  This appeared to have good blood supply and widely patent with no tension. The omentum was divided to allow easier access to the Roux limb and for the gastric pouch, although there was very good mobility.  The Roux limb was then brought up in a colic fashion with the candy cane facing the patient's left and the anastomosis was created with an initial posterior row of running 2-0 Vicryl between the Roux limb and the staple line of the gastric pouch.  The Ewald tube was passed down to the distal pouch. Using this as a stent, enterotomies were made with the pouch and the small-bowel with the harmonic scalpel.  Approximately a 2-cm  anastomosis was then created with a single firing of the blue load 45-mm Endo-GIA. The staple line was inspected and it was intact without bleeding.  The common enterotomy was then closed from either end with running 2-0 Vicryl.  The Ewald tube was passed back down through the anastomosis and an outer seromuscular row of 2-0 Vicryl was placed with an SH needle. The Ewald tube was then removed from the patient.  The Vonita Moss defect was closed, suturing the mesentery of the transverse colon over to the edge of the Roux limb mesentery.  Dr. Ezzard Standing then went above and with the outlet of the pouch clamped, he performed endoscopy and with the pouch tensely distended with air under saline irrigation, there was no evidence of air leak.  The pouch was desufflated.  Suture and staple lines of the gastrojejunostomy were coated with Tisseel tissue sealant. The Nathanson retractor was removed and this was replaced with a 5-mm trocar.  We turned attention to the gallbladder.  One additional 5-mm trocar was placed lateral in the right upper quadrant for retraction. The fundus was grasped and elevated over the liver and the infundibulum retracted inferolaterally.  Peritoneum anterior and  posterior to Calot triangle was incised and fibrofatty tissue was stripped off the neck of the gallbladder toward the porta hepatis.  Distal gallbladder and Calot triangle was thoroughly dissected and the cystic artery clearly identified  in Calot triangle coursing up the gallbladder wall.  The cystic duct gallbladder junction dissected 360 degrees.  When the anatomy was clear, the cystic artery was singly clipped and the cystic duct was clipped at the gallbladder junction.  An operative cholangiogram was then obtained through the cystic duct, which showed good filling of the normal common bile duct and intrahepatic ducts with free flow into the duodenum with no filling defects.  Following this, the cholangiocath was removed and the cystic duct was doubly clipped proximally and divided and the cystic artery was further clipped and divided.  The gallbladder was then dissected free from its bed using hook cautery, placed in an EndoCatch bag and brought out through the right upper quadrant trocar sites.  The abdomen was inspected for hemostasis.  There was no evidence of bleeding, trocar injury or other abnormality.  All CO2 was evacuated.  The trocars were removed.  The skin incisions were closed with subcuticular Monocryl and Dermabond. Sponge, needle and instrument counts were correct.  The patient was taken to the recovery room in good condition.     Lorne Skeens. Alauna Hayden, M.D.     [name]  D:  07/15/2010  T:  07/15/2010  Job:  [id number]  Electronically Signed by [name] M.D. on 07/23/2010 10:09:26 AM

## 2010-07-26 ENCOUNTER — Other Ambulatory Visit: Payer: Self-pay | Admitting: Obstetrics and Gynecology

## 2010-07-30 NOTE — Discharge Summary (Signed)
  [name], [name]            ACCOUNT NO.:  [id number]  MEDICAL RECORD NO.:  [id number]  LOCATION:  1535                         FACILITY:  [facility]  PHYSICIAN:  Sharlet Salina T. Ruqayya Ventress, M.D.DATE OF BIRTH:  11/14/1980  DATE OF ADMISSION:  07/15/2010 DATE OF DISCHARGE:  07/17/2010                              DISCHARGE SUMMARY   DISCHARGE DIAGNOSIS:  Morbid obesity.  SURGICAL PROCEDURE:  Laparoscopic Roux-en-Y gastric bypass on Jul 15, 2010.  HISTORY OF PRESENT ILLNESS:  [name] is a 30 year old female with a long history of morbid obesity.  She has a history of Lap-Band placement with excellent weight loss, but then subsequent erosion requiring Lap-Band removal in June 2011.  She has had significant weight regain up to a BMI of 37.9 and a weight of 201 pounds.  This has occurred despite maximum efforts at nonsurgical weight control.  After extensive preoperative discussion and workup detailed elsewhere,  the patient was admitted for laparoscopic Roux-en-Y gastric bypass.  PAST MEDICAL HISTORY:  She has had C-section.  Medically, she is followed for, 1. Hypertension. 2. Anxiety. 3. Possible bipolar disorder. 4. Elevated cholesterol, no treatment.  MEDICATIONS:  Lamictal, Seroquel, Prozac, and atenolol.  ALLERGIES:  None.  SOCIAL HISTORY:  See detailed H and P.  FAMILY HISTORY:  See detailed H and P.  REVIEW OF SYSTEMS:  Seem detailed H and P.  PERTINENT PHYSICAL EXAMINATION:  Significant for morbid obesity, weight of 201 pounds, BMI of 37.5.  HOSPITAL COURSE:  The morning of admission, she underwent an uneventful laparoscopic Roux-en-Y gastric bypass.  Adhesions in the upper abdomen were very manageable.  Her postoperative course was very smooth.  She had no complaints on the first postoperative day.  Vital signs were stable and CBC was within normal limits.  Gastrografin swallow showed no evidence of leak or obstruction.  She was started on p.o. fluids,  which she tolerated well.  On the second postoperative day, May 23rd, was advanced to protein shakes.  She tolerated these well.  White count was 7.8, hemoglobin 12.1.  Abdomen was benign and wound was healing well. She was discharged home at this time.  DISCHARGE MEDICATIONS:  The same as admission plus Roxicet for pain.  FOLLOWUP:  Followup will be in my office in 2 weeks.     Lorne Skeens. Gaelan Glennon, M.D.     [name]  D:  07/29/2010  T:  07/30/2010  Job:  [id number]  Electronically Signed by Glenna Fellows M.D. on 07/30/2010 09:09:59 AM

## 2010-08-29 ENCOUNTER — Encounter (INDEPENDENT_AMBULATORY_CARE_PROVIDER_SITE_OTHER): Payer: Self-pay | Admitting: General Surgery

## 2010-08-29 ENCOUNTER — Ambulatory Visit (INDEPENDENT_AMBULATORY_CARE_PROVIDER_SITE_OTHER): Payer: Managed Care, Other (non HMO) | Admitting: General Surgery

## 2010-08-29 NOTE — Patient Instructions (Signed)
Take a multivitamin daily. Use Immodium as needed. Exercise 3 times weekly. Stay hydrated.

## 2010-08-29 NOTE — Progress Notes (Signed)
Patient returns to the office now 7 weeks following gastric bypass. Her only complaint is some continued diarrhea. This is not severe and is controlled with Imodium. She is not having any abdominal pain nausea or vomiting. She has been very active exercising regularly.  On examination today she appears well.Abdomen is soft and nontender and wounds are well-healed.  She is doing Very well postoperatively.Her weight today is 190 pounds which is down 20 pounds from preop. We reviewed the diet and exercise  Strategies. She is to return in 6 weeks.

## 2010-09-03 ENCOUNTER — Encounter: Payer: Managed Care, Other (non HMO) | Attending: General Surgery | Admitting: *Deleted

## 2010-09-03 ENCOUNTER — Encounter: Payer: Self-pay | Admitting: *Deleted

## 2010-09-03 DIAGNOSIS — Z9884 Bariatric surgery status: Secondary | ICD-10-CM | POA: Insufficient documentation

## 2010-09-03 DIAGNOSIS — Z09 Encounter for follow-up examination after completed treatment for conditions other than malignant neoplasm: Secondary | ICD-10-CM | POA: Insufficient documentation

## 2010-09-03 DIAGNOSIS — Z713 Dietary counseling and surveillance: Secondary | ICD-10-CM | POA: Insufficient documentation

## 2010-09-03 NOTE — Progress Notes (Signed)
  Follow-up visit: 8 Weeks Post-Operative Gastric Bypass Surgery  Medical Nutrition Therapy:  Appt start time: 1730 end time:  1800.  Assessment:  Primary concerns today: post-operative bariatric surgery nutrition management.  Weight today: 192.8 lbs Weight change: 10.3 lbs lost since last visit Total weight lost: 21lb down (s/p Gastric Bypass) BMI: 36.1%  24-hr recall:  B (AM): EAS protein shake (17g protein) Snk (AM): 1 egg with cheese, 1 pc bacon  L (PM): 1/2 cup broccoli with cheese, 2 oz [place] breast deli meat Snk ( PM): None reported D (PM): Salad with deli meat (2 oz) and light dressing OR 1/2 Hot Dog (with bun) and chili Snk (PM): None reported  Fluid intake: Gatorade, water, sweet tea, juices = 30oz  Estimated total protein intake: 45-60g  Medications: No changes reported  Supplementation: 2 multivitamins (200%DV), 1500mg  calcium citrate, B12 *Pt notes that she has not been compliant with supplements but recently restarted.  Using straws: No Drinking while eating:No Hair loss: None reported Carbonated beverages: None reported N/V/D/C: None reported. Occasionally experiences "food getting stuck" when eating too fast Dumping syndrome: No dumping symptoms reported   Recent physical activity:  3-4 times per week via Wii Fit exercise, walking, and swimming (30 minutes at a time)  Progress Towards Goal(s):  In progress.     Nutritional Diagnosis:  NI-5.7.1 Inadequate protein intake As related to small portions of protein rich foods .  As evidenced by pt consuming ~65% of estimated protein needs..     Intervention:    Advance Diet to Phase 3B: High Protein + Non-starchy Vegetables  Continue fluid goal (64 oz +)  Increase protein to goal (75 g)  Avoid all sugar sweetened beverages and concentrated sweets  Eat protein-rich foods FIRST  Increase physical activity levels (3-4 times per week, 45-60 minutes)  Monitoring/Evaluation:  Dietary intake,  exercise, tolerance of food, and body weight. Follow up in 6 weeks for 3-4 month post-op visit.

## 2010-09-03 NOTE — Patient Instructions (Addendum)
   Advance Diet to Phase 3B: High Protein + Non-starchy Vegetables  Continue fluid goal (64 oz +)  Increase protein to goal (75 g)  Avoid all sugar sweetened beverages and concentrated sweets  Eat protein-rich foods FIRST  Increase physical activity levels (3-4 times per week, 45-60 minutes)

## 2010-09-09 ENCOUNTER — Encounter (HOSPITAL_COMMUNITY): Payer: Managed Care, Other (non HMO) | Admitting: Psychiatry

## 2010-09-09 DIAGNOSIS — F3189 Other bipolar disorder: Secondary | ICD-10-CM

## 2010-10-16 ENCOUNTER — Ambulatory Visit: Payer: Managed Care, Other (non HMO) | Admitting: *Deleted

## 2010-10-17 ENCOUNTER — Ambulatory Visit (INDEPENDENT_AMBULATORY_CARE_PROVIDER_SITE_OTHER): Payer: Managed Care, Other (non HMO) | Admitting: General Surgery

## 2010-10-21 ENCOUNTER — Ambulatory Visit: Payer: Managed Care, Other (non HMO) | Admitting: *Deleted

## 2010-11-18 ENCOUNTER — Encounter (HOSPITAL_COMMUNITY): Payer: Managed Care, Other (non HMO) | Admitting: Psychiatry

## 2010-11-18 DIAGNOSIS — F3189 Other bipolar disorder: Secondary | ICD-10-CM

## 2010-12-05 ENCOUNTER — Ambulatory Visit (INDEPENDENT_AMBULATORY_CARE_PROVIDER_SITE_OTHER): Payer: Managed Care, Other (non HMO) | Admitting: General Surgery

## 2010-12-05 ENCOUNTER — Other Ambulatory Visit (INDEPENDENT_AMBULATORY_CARE_PROVIDER_SITE_OTHER): Payer: Self-pay | Admitting: General Surgery

## 2010-12-05 VITALS — BP 118/86 | HR 62 | Temp 97.6°F | Resp 14 | Ht 61.0 in | Wt 174.4 lb

## 2010-12-05 DIAGNOSIS — K912 Postsurgical malabsorption, not elsewhere classified: Secondary | ICD-10-CM

## 2010-12-05 LAB — CBC WITH DIFFERENTIAL/PLATELET
Basophils Absolute: 0 10*3/uL (ref 0.0–0.1)
Eosinophils Absolute: 0.2 10*3/uL (ref 0.0–0.7)
Eosinophils Relative: 3 % (ref 0–5)
HCT: 39.9 % (ref 36.0–46.0)
Lymphocytes Relative: 34 % (ref 12–46)
Lymphs Abs: 1.7 10*3/uL (ref 0.7–4.0)
MCH: 31 pg (ref 26.0–34.0)
MCV: 91.5 fL (ref 78.0–100.0)
Monocytes Absolute: 0.4 10*3/uL (ref 0.1–1.0)
Platelets: 339 10*3/uL (ref 150–400)
RDW: 13.2 % (ref 11.5–15.5)
WBC: 5.1 10*3/uL (ref 4.0–10.5)

## 2010-12-05 NOTE — Progress Notes (Signed)
The patient returns for 3 month recheck status post revision of lap band to Roux-en-Y gastric bypass. She continues to do very well. Her chief complaint is loose stools occurring 3-4 times a day immediately after meals. Otherwise she has good energy, tolerating her diet without pain or nausea or other complaints.  Exam: General: Appears well Weight is 174 which represents a 26 pound weight loss from preop. BMI is 32.9 HEENT no palpable mass or thyromegaly Lymph nodes: No cervical supravesicular or inguinal nodes palpable Lungs: Clear without wheeze or increased work of breathing Abdomen: Soft, nontender. Incisions well healed without hernias. No masses or organomegaly.  Assessment and plan: Doing well following revision of her lap band Roux-en-Y gastric bypass with good progressive weight loss and no complications identified. We will try some Questran to see if this helps with her diarrhea. We'll obtain complete lab work today. Return in 3 months.

## 2010-12-05 NOTE — Patient Instructions (Signed)
Try Questran one to 3 times per day before meals as needed for diarrhea. Get 30-40 minutes of exercise 4 times per week.

## 2010-12-06 LAB — MAGNESIUM: Magnesium: 1.9 mg/dL (ref 1.5–2.5)

## 2010-12-06 LAB — COMPREHENSIVE METABOLIC PANEL
Alkaline Phosphatase: 93 U/L (ref 39–117)
Glucose, Bld: 81 mg/dL (ref 70–99)
Sodium: 139 mEq/L (ref 135–145)
Total Bilirubin: 0.5 mg/dL (ref 0.3–1.2)
Total Protein: 6.9 g/dL (ref 6.0–8.3)

## 2010-12-10 ENCOUNTER — Telehealth (INDEPENDENT_AMBULATORY_CARE_PROVIDER_SITE_OTHER): Payer: Self-pay | Admitting: General Surgery

## 2010-12-10 NOTE — Telephone Encounter (Signed)
Left voice message for patient to call our office for her lab results.

## 2011-01-06 ENCOUNTER — Other Ambulatory Visit (HOSPITAL_COMMUNITY): Payer: Self-pay | Admitting: Psychiatry

## 2011-01-06 MED ORDER — FLUOXETINE HCL 20 MG PO CAPS: 20.0000 mg | ORAL_CAPSULE | Freq: Two times a day (BID) | ORAL | Status: DC

## 2011-01-08 ENCOUNTER — Other Ambulatory Visit (HOSPITAL_COMMUNITY): Payer: Self-pay | Admitting: Psychiatry

## 2011-01-09 ENCOUNTER — Other Ambulatory Visit (HOSPITAL_COMMUNITY): Payer: Self-pay | Admitting: *Deleted

## 2011-01-09 DIAGNOSIS — F3189 Other bipolar disorder: Secondary | ICD-10-CM

## 2011-01-09 MED ORDER — LAMOTRIGINE 150 MG PO TABS: 150.0000 mg | ORAL_TABLET | Freq: Two times a day (BID) | ORAL | Status: DC

## 2011-01-09 NOTE — Telephone Encounter (Signed)
Reminded [name] to make follow up appointment.

## 2011-02-03 ENCOUNTER — Ambulatory Visit (HOSPITAL_COMMUNITY): Payer: Managed Care, Other (non HMO) | Admitting: Psychiatry

## 2011-02-10 ENCOUNTER — Encounter (HOSPITAL_COMMUNITY): Payer: Managed Care, Other (non HMO) | Admitting: Psychiatry

## 2011-03-10 ENCOUNTER — Other Ambulatory Visit (HOSPITAL_COMMUNITY): Payer: Self-pay | Admitting: *Deleted

## 2011-03-10 DIAGNOSIS — F3189 Other bipolar disorder: Secondary | ICD-10-CM

## 2011-03-10 MED ORDER — LAMOTRIGINE 150 MG PO TABS: 150.0000 mg | ORAL_TABLET | Freq: Two times a day (BID) | ORAL | Status: DC

## 2011-03-10 MED ORDER — FLUOXETINE HCL 20 MG PO CAPS: 20.0000 mg | ORAL_CAPSULE | Freq: Two times a day (BID) | ORAL | Status: DC

## 2011-03-17 ENCOUNTER — Ambulatory Visit (INDEPENDENT_AMBULATORY_CARE_PROVIDER_SITE_OTHER): Payer: Managed Care, Other (non HMO) | Admitting: Psychiatry

## 2011-03-17 DIAGNOSIS — F3189 Other bipolar disorder: Secondary | ICD-10-CM

## 2011-03-17 MED ORDER — LAMOTRIGINE 150 MG PO TABS: 150.0000 mg | ORAL_TABLET | Freq: Two times a day (BID) | ORAL | Status: DC

## 2011-03-17 MED ORDER — FLUOXETINE HCL 20 MG PO CAPS: 20.0000 mg | ORAL_CAPSULE | Freq: Two times a day (BID) | ORAL | Status: DC

## 2011-03-17 MED ORDER — QUETIAPINE FUMARATE 50 MG PO TABS: ORAL_TABLET | ORAL | Status: DC

## 2011-03-17 NOTE — Progress Notes (Signed)
Patient came for her followup appointment. She's been compliant with her medication. She still requires Seroquel 25-50 mg at bedtime for sleep. Recently she has been more stressed out about her parent's. Patient is been visiting her mother almost every weekend and seen her father more often. Patient to take Xanax when see her father as she feels very nervous and anxious around him. Patient denies any agitation anger her mood swing. She reported her job is going very well. She reported no side effects of medication. She denies any crying spells or any hopeless or helpless feeling.  Mental status examination Patient is casually dressed and fairly groomed. Her speech is soft clear and coherent. She described her mood is anxious and her affect is mood congruent. She denies any active or passive suicidal thoughts or homicidal thoughts. There were no psychotic symptoms present. Her attention and concentration is fair. She's alert and oriented x3. Her insight judgment impulse control is okay  Assessment Bipolar disorder NOS  Plan I will continue her current medication which are Lamictal 150 mg twice a day, Prozac 20 mg 2 times daily and Seroquel 50 mg but she takes half to 1 tablet as needed at bedtime. She reported no side effects of medication. I have explained risks and benefits of medication and recommended to call us if she has any question or concern about the medication. I will see her again in 10 weeks

## 2011-03-19 ENCOUNTER — Ambulatory Visit (INDEPENDENT_AMBULATORY_CARE_PROVIDER_SITE_OTHER): Payer: Managed Care, Other (non HMO) | Admitting: General Surgery

## 2011-03-19 ENCOUNTER — Encounter (INDEPENDENT_AMBULATORY_CARE_PROVIDER_SITE_OTHER): Payer: Self-pay | Admitting: General Surgery

## 2011-03-19 NOTE — Patient Instructions (Signed)
Try to incorporate regular exercise into your routine.

## 2011-03-19 NOTE — Progress Notes (Signed)
History: Patient returns for followup now 6 months following revision of lap band to Roux-en-Y gastric bypass following erosion. She continues to get along very well. Her only real complaint is diarrhea which can be significant at times and has not responded to Imodium or Questran. Other than that she feels great with good energy and slow continued weight loss. She has appropriate restriction. No dominant pain or nausea or vomiting.  Exam: BP 124/86  Pulse 80  Temp(Src) 98.6 F (37 C) (Temporal)  Resp 16  Ht 5\' 1"  (1.549 m)  Wt 164 lb 3.2 oz (74.481 kg)  BMI 31.03 kg/m2  General: Appears healthy Skin: Warm and dry no rash or infection Lungs: Clear without increased work of breathing Abdomen: Soft and nontender. Wounds well healed. No hernias or organomegaly.  Assessment and plan: Doing well following laparoscopic Roux-en-Y gastric bypass(revision) with good ongoing weight loss. She has not been exercising regularly and we discussed that she needs to add this to continue her success. Return in 3 months.

## 2011-04-17 ENCOUNTER — Other Ambulatory Visit (INDEPENDENT_AMBULATORY_CARE_PROVIDER_SITE_OTHER): Payer: Self-pay

## 2011-04-18 ENCOUNTER — Other Ambulatory Visit (INDEPENDENT_AMBULATORY_CARE_PROVIDER_SITE_OTHER): Payer: Self-pay

## 2011-04-18 DIAGNOSIS — R197 Diarrhea, unspecified: Secondary | ICD-10-CM

## 2011-04-18 MED ORDER — DIPHENOXYLATE-ATROPINE 2.5-0.025 MG PO TABS: ORAL_TABLET | ORAL | Status: DC

## 2011-05-28 ENCOUNTER — Ambulatory Visit (HOSPITAL_COMMUNITY): Payer: Managed Care, Other (non HMO) | Admitting: Psychiatry

## 2011-06-10 ENCOUNTER — Other Ambulatory Visit (HOSPITAL_COMMUNITY): Payer: Self-pay | Admitting: *Deleted

## 2011-06-10 DIAGNOSIS — F3189 Other bipolar disorder: Secondary | ICD-10-CM

## 2011-06-10 MED ORDER — FLUOXETINE HCL 20 MG PO CAPS: 20.0000 mg | ORAL_CAPSULE | Freq: Two times a day (BID) | ORAL | Status: DC

## 2011-06-13 ENCOUNTER — Other Ambulatory Visit (HOSPITAL_COMMUNITY): Payer: Self-pay | Admitting: Psychiatry

## 2011-06-18 ENCOUNTER — Encounter (HOSPITAL_COMMUNITY): Payer: Self-pay | Admitting: Psychiatry

## 2011-06-18 ENCOUNTER — Ambulatory Visit (INDEPENDENT_AMBULATORY_CARE_PROVIDER_SITE_OTHER): Payer: Managed Care, Other (non HMO) | Admitting: Psychiatry

## 2011-06-18 VITALS — BP 122/88 | HR 100 | Ht 61.0 in | Wt 157.0 lb

## 2011-06-18 DIAGNOSIS — F319 Bipolar disorder, unspecified: Secondary | ICD-10-CM

## 2011-06-18 MED ORDER — QUETIAPINE FUMARATE 50 MG PO TABS: ORAL_TABLET | ORAL | Status: DC

## 2011-06-18 MED ORDER — ALPRAZOLAM 0.25 MG PO TABS: 0.2500 mg | ORAL_TABLET | ORAL | Status: DC | PRN

## 2011-06-18 NOTE — Progress Notes (Signed)
Chief complaint Medication management and followup.  History of present illness Patient's 31 year old Caucasian married employed female who came for her followup appointment.  She lost another 6 pounds from her last visit.  She's excited and feel happy about her losing weight.  She's more active and involved in her daily life.  She's compliant with medication and reported no side effects.  She continues to have issues with her husband who is not supportive sometime .  Overall she likes her medication .  She still takes Seroquel one fourth along with Lamictal and Prozac.   She rarely require Xanax.  Her last Xanax was given in July and she still has a few left however she would required new prescription.  She's not drinking or using any illegal substance.  She's working at [employer] , she likes working there however she is not happy with her boss.  She denies any recent agitation anger or mood swings.  She is very much involved in her family and her son.  She denies any agitation anger or any mood swings.  She denies any recent crying spells or feeling of hopelessness or helplessness.  Consulted medication Seroquel 25 mg half at bedtime  Lamictal 150 mg twice a day Prozac 40 mg twice a day  Xanax 0.25 mg 1 tablet as needed for severe anxiety.    Medical history Patient has history of gastric bypass, gastric band and later gastric been removal.  Mental status examination Patient is casually dressed and fairly groomed.  She is pleasant , calm and cooperative.  Her speech is soft clear and coherent. She described her mood is good and her affect is mood congruent. She denies any active or passive suicidal thoughts or homicidal thoughts. There were no psychotic symptoms present.  There were no flight of idea or loose association.  Her attention and concentration is fair. She's alert and oriented x3. Her insight judgment impulse control is okay  Assessment Axis I Bipolar disorder NOS Axis II  deferred Axis III history of gastric bypass surgery Axis IV mild to moderate  Plan I will continue her current medication which are Lamictal 150 mg twice a day, Prozac 20 mg 2 times daily and Seroquel 50 mg but she takes half to 1 tablet as needed at bedtime. She reported no side effects of medication. I have explained risks and benefits of medication and recommended to call us if she has any question or concern about the medication. I will see her again in 3 months.

## 2011-06-19 ENCOUNTER — Ambulatory Visit (INDEPENDENT_AMBULATORY_CARE_PROVIDER_SITE_OTHER): Payer: Managed Care, Other (non HMO) | Admitting: General Surgery

## 2011-06-19 DIAGNOSIS — Z9884 Bariatric surgery status: Secondary | ICD-10-CM

## 2011-06-19 NOTE — Progress Notes (Signed)
Chief complaint: Followup gastric bypass  History: Patient returns for followup of her revisional bariatric surgery, conversion of LAP-BAND 2 Roux-en-Y gastric bypass with surgery date Jul 06, 2010. She is doing very well. No real complaints. She had a lot of diarrhea initially this now well controlled with Lomotil. She has some flatulence controlled with medication. Her energy is good and she is beginning to exercise regular. No other bowel complaints.  Exam: BP 114/68  Pulse 71  Temp(Src) 97.2 F (36.2 C) (Temporal)  Resp 16  Ht 5\' 1"  (1.549 m)  Wt 156 lb 12.8 oz (71.124 kg)  BMI 29.63 kg/m2 Total weight loss of 49 pounds General: Appears well Skin: Rush infection HEENT no palpable masses or thyromegaly Lymph nodes no cervical subclavicular nodes palpable Abdomen soft and nontender. No organomegaly. No hernias.  Assessment and plan: Doing well with excellent weight loss total weight loss now 49 pounds. Dr. Timothy Lasso has just lab work we'll obtain. Return in 3 months per

## 2011-07-14 ENCOUNTER — Other Ambulatory Visit (HOSPITAL_COMMUNITY): Payer: Self-pay | Admitting: Psychiatry

## 2011-08-01 ENCOUNTER — Telehealth (INDEPENDENT_AMBULATORY_CARE_PROVIDER_SITE_OTHER): Payer: Self-pay

## 2011-08-01 NOTE — Telephone Encounter (Signed)
Called patient with follow up appointment for Tuesday 08/26/11 @ 11:30 w/Dr. Johna Sheriff.

## 2011-08-04 ENCOUNTER — Other Ambulatory Visit (HOSPITAL_COMMUNITY): Payer: Self-pay

## 2011-08-04 DIAGNOSIS — F3189 Other bipolar disorder: Secondary | ICD-10-CM

## 2011-08-04 MED ORDER — FLUOXETINE HCL 20 MG PO CAPS: 20.0000 mg | ORAL_CAPSULE | Freq: Two times a day (BID) | ORAL | Status: DC

## 2011-08-07 ENCOUNTER — Other Ambulatory Visit (HOSPITAL_COMMUNITY): Payer: Self-pay

## 2011-08-26 ENCOUNTER — Ambulatory Visit (INDEPENDENT_AMBULATORY_CARE_PROVIDER_SITE_OTHER): Payer: Managed Care, Other (non HMO) | Admitting: General Surgery

## 2011-09-05 ENCOUNTER — Other Ambulatory Visit (INDEPENDENT_AMBULATORY_CARE_PROVIDER_SITE_OTHER): Payer: Self-pay

## 2011-09-05 ENCOUNTER — Telehealth (INDEPENDENT_AMBULATORY_CARE_PROVIDER_SITE_OTHER): Payer: Self-pay

## 2011-09-05 ENCOUNTER — Telehealth (INDEPENDENT_AMBULATORY_CARE_PROVIDER_SITE_OTHER): Payer: Self-pay | Admitting: General Surgery

## 2011-09-05 DIAGNOSIS — R197 Diarrhea, unspecified: Secondary | ICD-10-CM

## 2011-09-05 MED ORDER — DIPHENOXYLATE-ATROPINE 2.5-0.025 MG PO TABS: ORAL_TABLET | ORAL | Status: DC

## 2011-09-05 NOTE — Telephone Encounter (Signed)
Rx for Lotmotil called in to [facility], 30 day supply.

## 2011-09-05 NOTE — Telephone Encounter (Signed)
Patient called in to follow up regarding a prescription request that was faxed to the office earlier this week. Patient was trying to get a status because per the pharmacy it has not been approved yet. Patient stated she needed her medication for the weekend. Placed call on hold and spoke with Neysa Bonito who advised that he will review the information as soon as he has finished clinic this morning and for her to call her pharmacy sometime after lunch to confirm it has been filled. Patient advised of this and she agreed.

## 2011-09-07 ENCOUNTER — Other Ambulatory Visit (HOSPITAL_COMMUNITY): Payer: Self-pay | Admitting: Psychiatry

## 2011-09-07 DIAGNOSIS — F319 Bipolar disorder, unspecified: Secondary | ICD-10-CM

## 2011-09-08 ENCOUNTER — Other Ambulatory Visit (HOSPITAL_COMMUNITY): Payer: Self-pay | Admitting: *Deleted

## 2011-09-08 DIAGNOSIS — F3189 Other bipolar disorder: Secondary | ICD-10-CM

## 2011-09-08 MED ORDER — FLUOXETINE HCL 20 MG PO CAPS: 20.0000 mg | ORAL_CAPSULE | Freq: Two times a day (BID) | ORAL | Status: DC

## 2011-09-15 ENCOUNTER — Encounter (HOSPITAL_COMMUNITY): Payer: Self-pay | Admitting: Psychiatry

## 2011-09-15 ENCOUNTER — Ambulatory Visit (INDEPENDENT_AMBULATORY_CARE_PROVIDER_SITE_OTHER): Payer: Managed Care, Other (non HMO) | Admitting: Psychiatry

## 2011-09-15 DIAGNOSIS — F319 Bipolar disorder, unspecified: Secondary | ICD-10-CM

## 2011-09-15 NOTE — Progress Notes (Signed)
Chief complaint Medication management and followup.  History of present illness Patient's 31 year old Caucasian married employed female who came for her followup appointment.  She is been stable on her current psychiatric medication.  She still takes Seroquel one fourth every night for sleep.  Overall her mood has been better.  She is compliant with the medication and reported no side effects.  Her son came with her due to his dentist appointment.  Patient reported her husband also very cooperative and recently there has been no issues.  She denies any agitation anger mood swing.  She denies any tremors or any rash.  She's not drinking or using any illegal substance.  She is taking lomotil prescribed by her Dr. for chronic GI issues.  Consulted medication Seroquel 25 mg half at bedtime  Lamictal 150 mg twice a day Prozac 40 mg twice a day  Xanax 0.25 mg 1 tablet as needed for severe anxiety.    Medical history Patient has history of gastric bypass, gastric band and later gastric been removal.  Mental status examination Patient is casually dressed and fairly groomed.  She is pleasant , calm and cooperative.  Her speech is soft clear and coherent. She described her mood is good and her affect is mood congruent. She denies any active or passive suicidal thoughts or homicidal thoughts. There were no psychotic symptoms present.  There were no flight of idea or loose association.  Her attention and concentration is fair. She's alert and oriented x3. Her insight judgment impulse control is okay  Assessment Axis I Bipolar disorder NOS Axis II deferred Axis III history of gastric bypass surgery Axis IV mild to moderate  Plan I will continue her current medication which are Lamictal 150 mg twice a day, Prozac 20 mg 2 times daily and Seroquel 50 mg but she takes 1/4th at bed time. I have explained risks and benefits of medication and recommended to call us if she has any question or concern about the  medication. I will see her again in 3 months.

## 2011-10-07 ENCOUNTER — Telehealth (INDEPENDENT_AMBULATORY_CARE_PROVIDER_SITE_OTHER): Payer: Self-pay

## 2011-10-07 NOTE — Telephone Encounter (Signed)
We received the fax for refill on Lomotil #180 1-2 po tid prn diarrhea from [facility].  Dr Johna Sheriff called and gave the authorization and I [phone] with no additional refill.

## 2011-10-07 NOTE — Telephone Encounter (Signed)
The patient called to make sure we got the [facility] for her Lom[facility]il.  She states she is low and wants it done by tomorrow if possible.  I told her Dr Johna Sheriff is available so we will get in touch with him.

## 2011-10-30 ENCOUNTER — Other Ambulatory Visit (HOSPITAL_COMMUNITY): Payer: Self-pay | Admitting: Psychiatry

## 2011-10-30 ENCOUNTER — Encounter (INDEPENDENT_AMBULATORY_CARE_PROVIDER_SITE_OTHER): Payer: Self-pay | Admitting: General Surgery

## 2011-10-30 ENCOUNTER — Ambulatory Visit (INDEPENDENT_AMBULATORY_CARE_PROVIDER_SITE_OTHER): Payer: Managed Care, Other (non HMO) | Admitting: General Surgery

## 2011-10-30 VITALS — BP 122/82 | HR 77 | Temp 97.3°F | Resp 16 | Ht 61.0 in | Wt 150.6 lb

## 2011-10-30 DIAGNOSIS — Z9884 Bariatric surgery status: Secondary | ICD-10-CM

## 2011-10-30 DIAGNOSIS — R197 Diarrhea, unspecified: Secondary | ICD-10-CM

## 2011-10-30 DIAGNOSIS — F319 Bipolar disorder, unspecified: Secondary | ICD-10-CM

## 2011-10-30 MED ORDER — DIPHENOXYLATE-ATROPINE 2.5-0.025 MG PO TABS: ORAL_TABLET | ORAL | Status: DC

## 2011-10-30 MED ORDER — ONDANSETRON HCL 4 MG PO TABS: 4.0000 mg | ORAL_TABLET | Freq: Three times a day (TID) | ORAL | Status: AC | PRN

## 2011-10-30 NOTE — Progress Notes (Signed)
History: Patient returns for routine followup now approaching 1-1/2 years following laparoscopic Roux-en-Y gastric bypass( conversion from LAP-BAND) for morbid obesity. She continues to do very well. She had significant diarrhea but this is well controlled with Lomotil. She has rare nausea. No abdominal pain or vomiting. Energy is excellent. She has managed to lose an additional 6 pounds since her last visit. No complaints today.  Exam: BP 122/82  Pulse 77  Temp 97.3 F (36.3 C) (Temporal)  Resp 16  Ht 5\' 1"  (1.549 m)  Wt 150 lb 9.6 oz (68.312 kg)  BMI 28.46 kg/m2 Total weight loss 56 pound  General: Appears well Skin: No rash or infection Lymph nodes: No cervical or supraclavicular nodes palpable Lungs: Clear equal breath sounds Cardiac: Regular rate and rhythm. No murmur Abdomen: Soft and nontender. No hernias. No masses  Assessment and plan: Doing very well following her Y. Gastric bypass with excellent maintain weight loss and no pericardial complications. She will see her primary in January and we will defer lab work until then. She will return for yearly visits.

## 2011-12-24 ENCOUNTER — Ambulatory Visit (INDEPENDENT_AMBULATORY_CARE_PROVIDER_SITE_OTHER): Payer: Managed Care, Other (non HMO) | Admitting: Psychiatry

## 2011-12-24 ENCOUNTER — Encounter (HOSPITAL_COMMUNITY): Payer: Self-pay | Admitting: Psychiatry

## 2011-12-24 VITALS — BP 139/82 | HR 85 | Wt 144.4 lb

## 2011-12-24 DIAGNOSIS — F3189 Other bipolar disorder: Secondary | ICD-10-CM

## 2011-12-24 DIAGNOSIS — F319 Bipolar disorder, unspecified: Secondary | ICD-10-CM

## 2011-12-24 MED ORDER — FLUOXETINE HCL 20 MG PO CAPS: 20.0000 mg | ORAL_CAPSULE | Freq: Two times a day (BID) | ORAL | Status: DC

## 2011-12-24 MED ORDER — LAMOTRIGINE 150 MG PO TABS: 150.0000 mg | ORAL_TABLET | Freq: Two times a day (BID) | ORAL | Status: DC

## 2011-12-24 NOTE — Progress Notes (Signed)
Chief complaint Medication management and followup.  History of present illness Patient is 31 year old Caucasian married employed female who came for her followup appointment.  She is been stable on her current psychiatric medication.  She has been busy at her job .  She also tried to go to school at [employer] in business but could not handle the stress and talk herself out of.  She feels disappointed however she denies that she's been very busy I cannot take more stress.  She still takes Seroquel one fourth every night for sleep.  She denies any irritability agitation anger mood swing.  She has chronic family stress but she is handing better.  She is compliant with the medication and reported no side effects.  She denies any agitation anger mood swing.  She denies any tremors or any rash.  She's not drinking or using any illegal substance.  She is taking lomotil prescribed by her Dr. for chronic GI issues.  She scheduled to see her primary care physician Victorino Dike blood work.  Consulted medication Seroquel 25 mg half at bedtime  Lamictal 150 mg twice a day Prozac 20 mg twice a day  Xanax 0.25 mg 1 tablet as needed for severe anxiety.    Medical history Patient has history of gastric bypass, gastric band and later gastric band removal. Her PCP is Dr Timothy Lasso.  Mental status examination Patient is casually dressed and fairly groomed.  She is pleasant , calm and cooperative.  Her speech is soft clear and coherent. She described her mood is good and her affect is mood congruent. She denies any active or passive suicidal thoughts or homicidal thoughts. There were no psychotic symptoms present.  There were no flight of idea or loose association.  Her attention and concentration is fair. She's alert and oriented x3. Her insight judgment impulse control is okay  Assessment Axis I Bipolar disorder NOS Axis II deferred Axis III history of gastric bypass surgery Axis IV mild to moderate  Plan I  will continue her current medication which are Lamictal 150 mg twice a day, Prozac 20 mg 2 times daily and Seroquel 50 mg but she takes 1/4th at bed time. I have explained risks and benefits of medication and recommended to call us if she has any question or concern about the medication. I will see her again in 3 months.

## 2011-12-26 ENCOUNTER — Other Ambulatory Visit (HOSPITAL_COMMUNITY): Payer: Self-pay | Admitting: Psychiatry

## 2012-01-11 ENCOUNTER — Other Ambulatory Visit (HOSPITAL_COMMUNITY): Payer: Self-pay | Admitting: Psychiatry

## 2012-01-11 DIAGNOSIS — F319 Bipolar disorder, unspecified: Secondary | ICD-10-CM

## 2012-01-12 ENCOUNTER — Other Ambulatory Visit (HOSPITAL_COMMUNITY): Payer: Self-pay | Admitting: Psychiatry

## 2012-01-13 ENCOUNTER — Other Ambulatory Visit (HOSPITAL_COMMUNITY): Payer: Self-pay | Admitting: *Deleted

## 2012-01-13 DIAGNOSIS — F319 Bipolar disorder, unspecified: Secondary | ICD-10-CM

## 2012-01-13 MED ORDER — ALPRAZOLAM 0.25 MG PO TABS: 0.2500 mg | ORAL_TABLET | ORAL | Status: DC | PRN

## 2012-01-13 NOTE — Telephone Encounter (Signed)
Correction of refill completed 11/18. Erroneously entered as printed. Prescription was called to pharmacy

## 2012-01-14 ENCOUNTER — Telehealth (INDEPENDENT_AMBULATORY_CARE_PROVIDER_SITE_OTHER): Payer: Self-pay | Admitting: General Surgery

## 2012-01-14 ENCOUNTER — Other Ambulatory Visit (INDEPENDENT_AMBULATORY_CARE_PROVIDER_SITE_OTHER): Payer: Self-pay

## 2012-01-14 DIAGNOSIS — R197 Diarrhea, unspecified: Secondary | ICD-10-CM

## 2012-01-14 MED ORDER — DIPHENOXYLATE-ATROPINE 2.5-0.025 MG PO TABS: ORAL_TABLET | ORAL | Status: DC

## 2012-01-14 NOTE — Progress Notes (Signed)
Refill request rec'd via fax, Dr. Johna Sheriff authorized refill for Diphen/Atropine, take 1-2 tabs po, tid, prn diarrhea with prn refills, #90-- per Dr. Johna Sheriff.  Medication changed in EPIC to reflect updated medication.

## 2012-01-14 NOTE — Telephone Encounter (Signed)
Faxed refill auth for Diphen/Atropine 1-2 tab p o 3x daily prn diarrhea with PRN refills per Dr.Hoxworth

## 2012-01-27 ENCOUNTER — Other Ambulatory Visit (HOSPITAL_COMMUNITY): Payer: Self-pay | Admitting: *Deleted

## 2012-01-27 DIAGNOSIS — F319 Bipolar disorder, unspecified: Secondary | ICD-10-CM

## 2012-01-27 DIAGNOSIS — F3189 Other bipolar disorder: Secondary | ICD-10-CM

## 2012-01-27 MED ORDER — LAMOTRIGINE 150 MG PO TABS: 150.0000 mg | ORAL_TABLET | Freq: Two times a day (BID) | ORAL | Status: DC

## 2012-01-27 MED ORDER — FLUOXETINE HCL 20 MG PO CAPS: 20.0000 mg | ORAL_CAPSULE | Freq: Two times a day (BID) | ORAL | Status: DC

## 2012-01-27 NOTE — Telephone Encounter (Signed)
90 day refill authorized by Dr.Arfeen

## 2012-02-29 ENCOUNTER — Other Ambulatory Visit (HOSPITAL_COMMUNITY): Payer: Self-pay | Admitting: Psychiatry

## 2012-02-29 DIAGNOSIS — F319 Bipolar disorder, unspecified: Secondary | ICD-10-CM

## 2012-03-25 ENCOUNTER — Encounter (HOSPITAL_COMMUNITY): Payer: Self-pay | Admitting: Psychiatry

## 2012-03-25 ENCOUNTER — Ambulatory Visit (INDEPENDENT_AMBULATORY_CARE_PROVIDER_SITE_OTHER): Payer: Managed Care, Other (non HMO) | Admitting: Psychiatry

## 2012-03-25 VITALS — BP 113/79 | HR 75 | Wt 142.6 lb

## 2012-03-25 DIAGNOSIS — F3189 Other bipolar disorder: Secondary | ICD-10-CM

## 2012-03-25 DIAGNOSIS — F319 Bipolar disorder, unspecified: Secondary | ICD-10-CM

## 2012-03-25 MED ORDER — FLUOXETINE HCL 20 MG PO CAPS: 20.0000 mg | ORAL_CAPSULE | Freq: Two times a day (BID) | ORAL | Status: DC

## 2012-03-25 MED ORDER — LAMOTRIGINE 150 MG PO TABS: 150.0000 mg | ORAL_TABLET | Freq: Two times a day (BID) | ORAL | Status: DC

## 2012-03-25 NOTE — Progress Notes (Signed)
Chief complaint Medication management and followup.  History of present illness Patient is 32 year old Caucasian married employed female who came for her followup appointment.  She is been stable on her current psychiatric medication.  Sometimes she gets stressed about her job .  She's been very busy at work.  She had a good Christmas and holidays.  She visited her family in [place].  Her grandmother is in nursing home and she was disappointed to see her.  She feels other family member does not help her that much.  Overall she feels that current medication is working very well.  She denies any agitation anger mood swing.  She scheduled a see her primary care physician tomorrow.  There has been no new medicine added .  She's not drinking or using any illegal substance. She has not use Xanax since last visit.  Consulted medication Seroquel 25 mg half at bedtime  Lamictal 150 mg twice a day Prozac 20 mg twice a day  Xanax 0.25 mg 1 tablet as needed for severe anxiety.    Medical history Patient has history of gastric bypass, gastric band and later gastric band removal. Her PCP is Dr Timothy Lasso.  Mental status examination Patient is casually dressed and fairly groomed.  She is anxious but calm and cooperative.  Her speech is soft clear and coherent. She described her mood is good and her affect is mood congruent. She denies any active or passive suicidal thoughts or homicidal thoughts. There were no psychotic symptoms present.  There were no flight of idea or loose association.  Her attention and concentration is fair. She's alert and oriented x3. Her insight judgment impulse control is okay  Assessment Axis I Bipolar disorder NOS Axis II deferred Axis III history of gastric bypass surgery Axis IV mild to moderate  Plan I will continue her current medication which are Lamictal 150 mg twice a day, Prozac 20 mg 2 times daily and Seroquel 50 mg but she takes 1/4th at bed time. I have explained risks  and benefits of medication and recommended to call us if she has any question or concern about the medication. I will see her again in 3 months.

## 2012-03-30 ENCOUNTER — Other Ambulatory Visit (HOSPITAL_COMMUNITY): Payer: Self-pay | Admitting: Psychiatry

## 2012-03-30 DIAGNOSIS — F319 Bipolar disorder, unspecified: Secondary | ICD-10-CM

## 2012-03-30 MED ORDER — QUETIAPINE FUMARATE 50 MG PO TABS: ORAL_TABLET | ORAL | Status: DC

## 2012-03-30 MED ORDER — ALPRAZOLAM 0.25 MG PO TABS: 0.2500 mg | ORAL_TABLET | ORAL | Status: DC | PRN

## 2012-04-15 IMAGING — CR DG CHEST 2V
2 series · 2 of 2 positions shown · non-contrast
Comparison: Abdominal films from [id number].

CLINICAL DATA: Body ache, sore throat, congestion and shortness of
breath.

CHEST - 2 VIEW

[w chest pa]
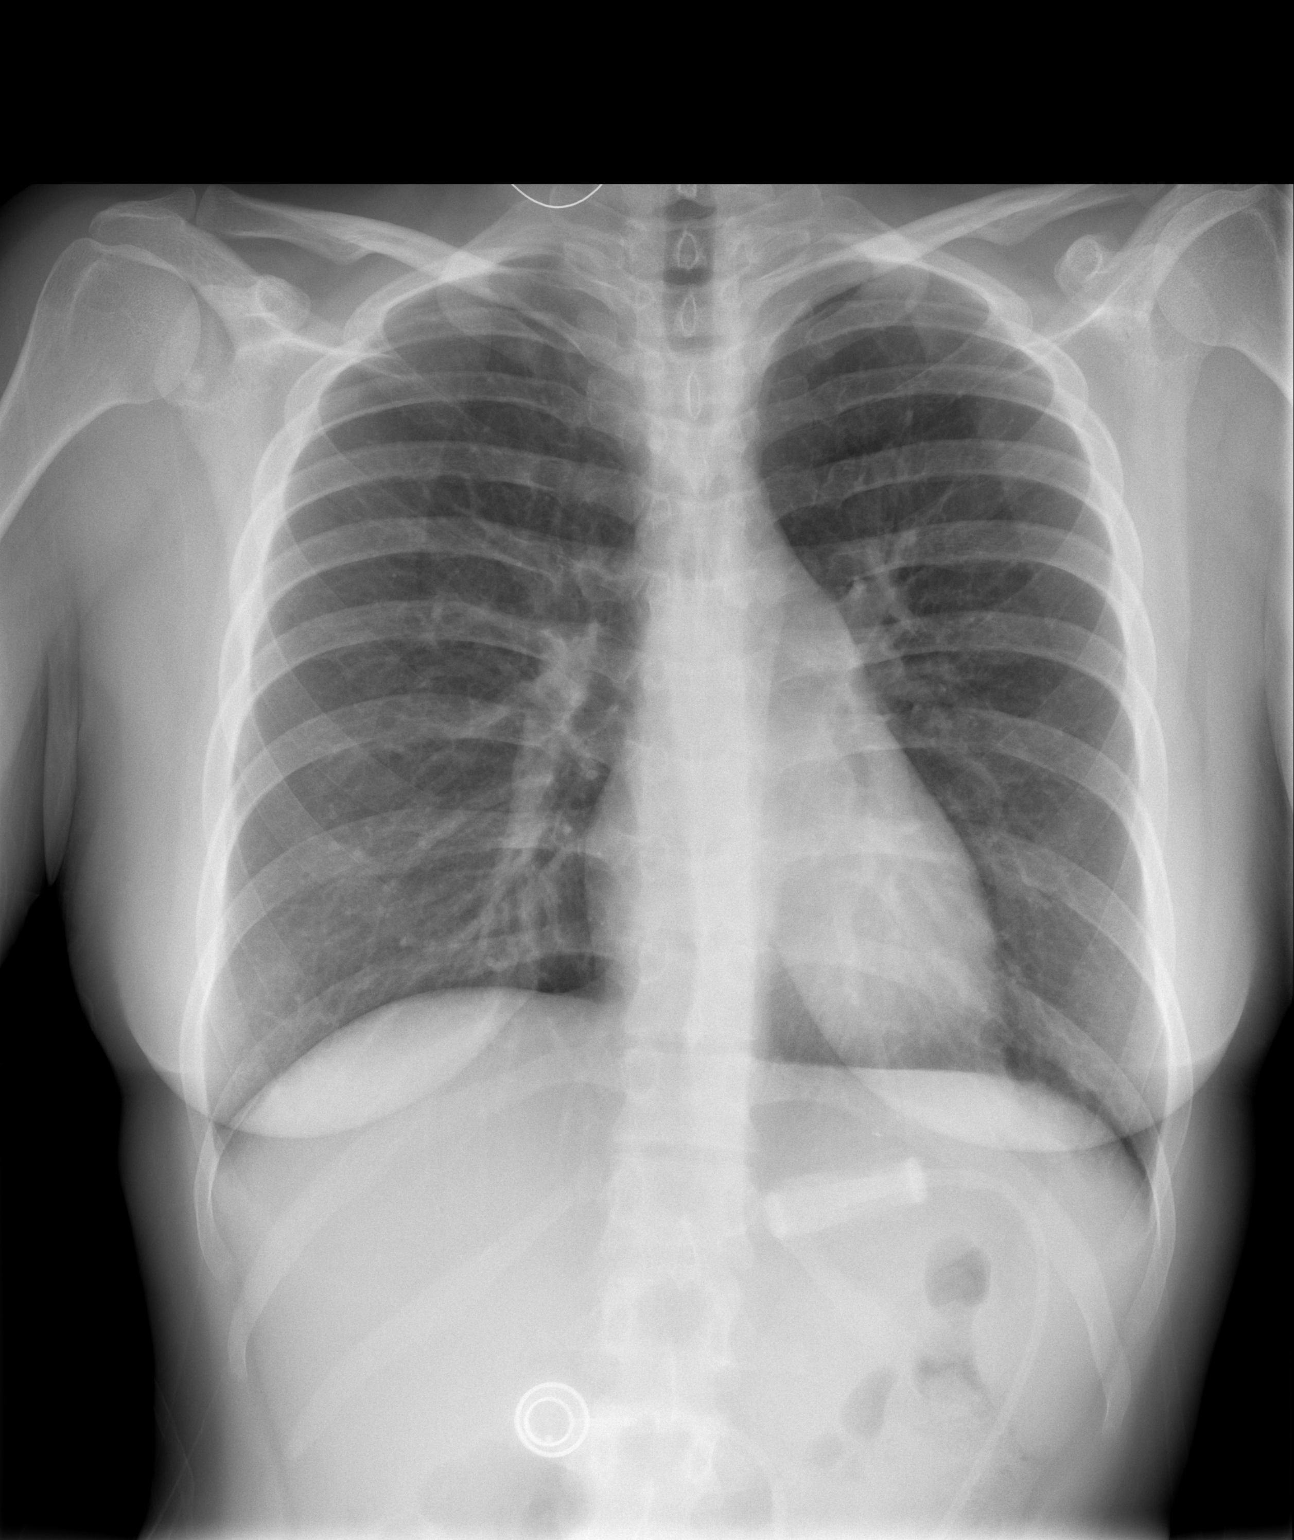

[w chest lat]
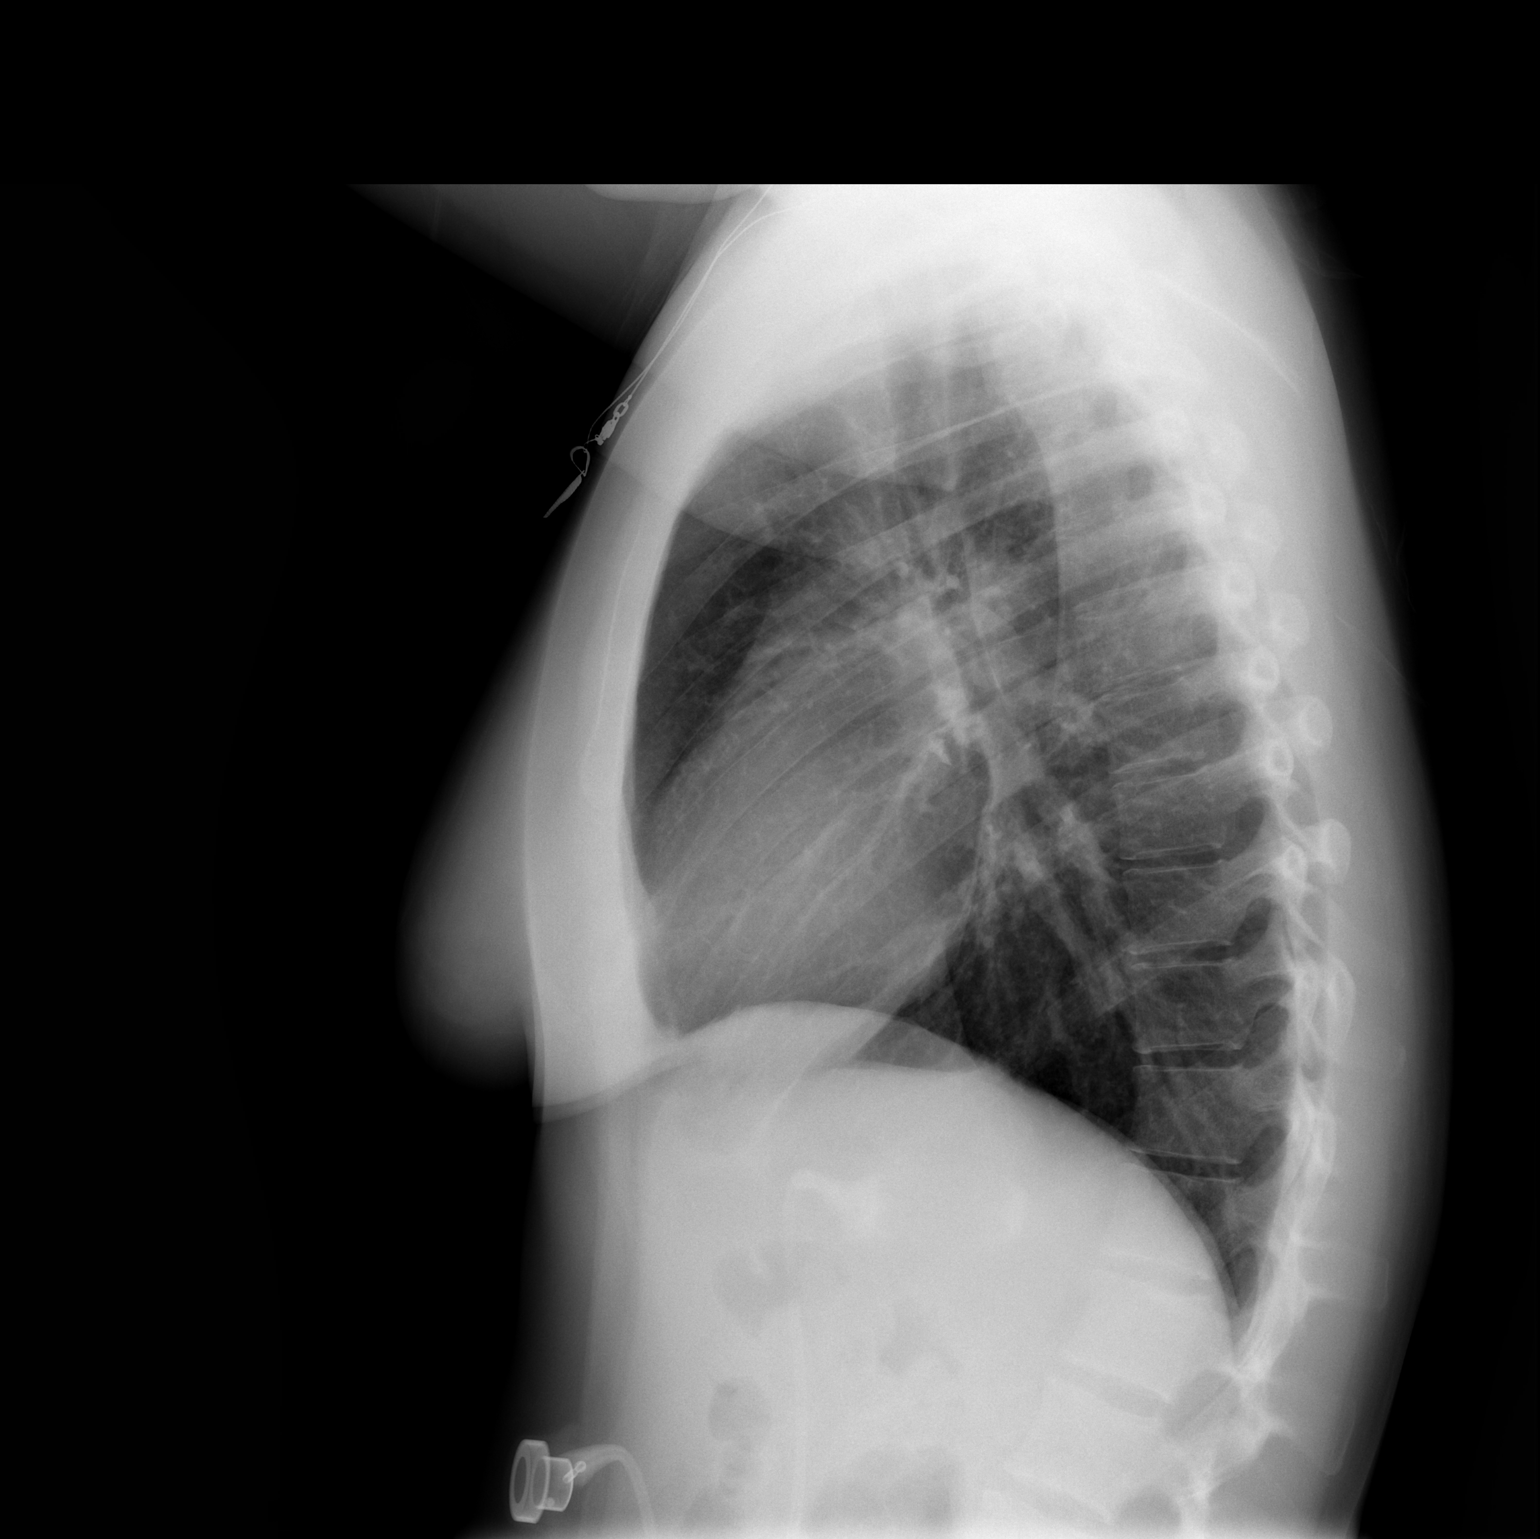

[2 of 2 positions shown; findings below may reference images not displayed]

FINDINGS: Lungs are clear.  No edema, infiltrate, nodule or pleural
fluid identified.  The heart size and mediastinal contours are
within normal limits.  Bony thorax is unremarkable.

Visualized upper abdomen shows gastric band with attached port.
The band is stable in orientation at roughly the [DATE] to [DATE]
position.
IMPRESSION: No active disease.

## 2012-04-16 ENCOUNTER — Telehealth (INDEPENDENT_AMBULATORY_CARE_PROVIDER_SITE_OTHER): Payer: Self-pay

## 2012-04-16 ENCOUNTER — Other Ambulatory Visit (INDEPENDENT_AMBULATORY_CARE_PROVIDER_SITE_OTHER): Payer: Self-pay

## 2012-04-16 DIAGNOSIS — R110 Nausea: Secondary | ICD-10-CM

## 2012-04-16 MED ORDER — ONDANSETRON HCL 4 MG PO TABS: 4.0000 mg | ORAL_TABLET | Freq: Three times a day (TID) | ORAL | Status: DC | PRN

## 2012-04-16 NOTE — Telephone Encounter (Signed)
Faxed [facility] approval to [facility] [phone] for Ondansetron 4mg , 1 tablet, q8hrs as needed for nausea #20, w/3 refills

## 2012-05-11 ENCOUNTER — Other Ambulatory Visit (HOSPITAL_COMMUNITY): Payer: Self-pay | Admitting: Psychiatry

## 2012-05-12 ENCOUNTER — Other Ambulatory Visit (HOSPITAL_COMMUNITY): Payer: Self-pay | Admitting: Psychiatry

## 2012-05-20 ENCOUNTER — Telehealth (HOSPITAL_COMMUNITY): Payer: Self-pay

## 2012-05-20 NOTE — Telephone Encounter (Signed)
Called returned and left message.

## 2012-05-21 ENCOUNTER — Telehealth (HOSPITAL_COMMUNITY): Payer: Self-pay

## 2012-05-21 NOTE — Telephone Encounter (Signed)
Patient called.  She is very stressed about her job.  She endorse increased anxiety and nervousness.  She wants to see therapist however she like FMLA to be filled so that she can come to the doctors appointment. I recommend to bring FMLA paper today so we can fill and I will also schedule appointment with therapist.  Patient accepted.  We will complete her FMLA paper and faxed back on Monday.

## 2012-05-24 ENCOUNTER — Telehealth (HOSPITAL_COMMUNITY): Payer: Self-pay | Admitting: Psychiatry

## 2012-05-24 NOTE — Telephone Encounter (Signed)
FMLA completed.

## 2012-05-25 ENCOUNTER — Telehealth (HOSPITAL_COMMUNITY): Payer: Self-pay | Admitting: *Deleted

## 2012-05-25 NOTE — Telephone Encounter (Signed)
See phone note

## 2012-06-01 ENCOUNTER — Telehealth (HOSPITAL_COMMUNITY): Payer: Self-pay

## 2012-06-01 ENCOUNTER — Ambulatory Visit (INDEPENDENT_AMBULATORY_CARE_PROVIDER_SITE_OTHER): Payer: Managed Care, Other (non HMO) | Admitting: Psychiatry

## 2012-06-01 ENCOUNTER — Encounter (HOSPITAL_COMMUNITY): Payer: Self-pay | Admitting: Psychiatry

## 2012-06-01 DIAGNOSIS — F3160 Bipolar disorder, current episode mixed, unspecified: Secondary | ICD-10-CM

## 2012-06-01 NOTE — Telephone Encounter (Signed)
Goals returned. Patient started counseling today she is feeling more depressed tearful and crying.  She could not concentrate at work.  Recommended FMLA so that she can concentrate on her therapy.
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
## 2012-06-02 ENCOUNTER — Telehealth (HOSPITAL_COMMUNITY): Payer: Self-pay

## 2012-06-08 ENCOUNTER — Encounter (HOSPITAL_COMMUNITY): Payer: Self-pay | Admitting: Psychiatry

## 2012-06-08 ENCOUNTER — Ambulatory Visit (INDEPENDENT_AMBULATORY_CARE_PROVIDER_SITE_OTHER): Payer: Managed Care, Other (non HMO) | Admitting: Psychiatry

## 2012-06-08 DIAGNOSIS — F3162 Bipolar disorder, current episode mixed, moderate: Secondary | ICD-10-CM

## 2012-06-08 NOTE — Progress Notes (Signed)
   THERAPIST PROGRESS NOTE  Session Time: 8:30-9:30  Participation Level: Active  Behavioral Response: CasualAlertAnxious  Type of Therapy: Individual Therapy  Treatment Goals addressed: stress management, communication skills, career exploration  Interventions: CBT  Summary: [name] is a 32 y.o. female who presents with bipolar 1, current episode mixed.   Suicidal/Homicidal: Nowithout intent/plan  Therapist Response: Pt.'s anxiety was high as evidenced by reported anxiety and fear related to returning to work, restlessness, tearfulness. Therapist referred client to psych IOP and escorted to assessment. After consulting with Hawthorne Sink, client agreed to begin psych IOP on 4/16.  Plan: Return again in 3 weeks.  Diagnosis: Axis I: Bipolar, mixed    Axis II: No diagnosis    [name] 06/08/2012

## 2012-06-15 ENCOUNTER — Ambulatory Visit (HOSPITAL_COMMUNITY): Payer: Self-pay | Admitting: Psychiatry

## 2012-06-16 ENCOUNTER — Encounter (HOSPITAL_COMMUNITY): Payer: Self-pay | Admitting: Psychiatry

## 2012-06-16 ENCOUNTER — Ambulatory Visit (INDEPENDENT_AMBULATORY_CARE_PROVIDER_SITE_OTHER): Payer: Managed Care, Other (non HMO) | Admitting: Psychiatry

## 2012-06-16 DIAGNOSIS — F3162 Bipolar disorder, current episode mixed, moderate: Secondary | ICD-10-CM
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
## 2012-06-22 ENCOUNTER — Ambulatory Visit (HOSPITAL_COMMUNITY): Payer: Self-pay | Admitting: Psychiatry

## 2012-06-23 ENCOUNTER — Encounter (HOSPITAL_COMMUNITY): Payer: Self-pay | Admitting: Psychiatry

## 2012-06-23 ENCOUNTER — Other Ambulatory Visit (HOSPITAL_COMMUNITY): Payer: Self-pay | Admitting: Psychiatry

## 2012-06-23 ENCOUNTER — Ambulatory Visit (INDEPENDENT_AMBULATORY_CARE_PROVIDER_SITE_OTHER): Payer: Managed Care, Other (non HMO) | Admitting: Psychiatry

## 2012-06-23 VITALS — BP 102/74 | HR 100 | Ht 61.75 in | Wt 145.2 lb

## 2012-06-23 DIAGNOSIS — F319 Bipolar disorder, unspecified: Secondary | ICD-10-CM

## 2012-06-23 DIAGNOSIS — F3189 Other bipolar disorder: Secondary | ICD-10-CM

## 2012-06-23 MED ORDER — FLUOXETINE HCL 20 MG PO CAPS: 20.0000 mg | ORAL_CAPSULE | Freq: Two times a day (BID) | ORAL | Status: DC

## 2012-06-23 MED ORDER — LAMOTRIGINE 150 MG PO TABS: 150.0000 mg | ORAL_TABLET | Freq: Two times a day (BID) | ORAL | Status: DC
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
## 2012-06-24 ENCOUNTER — Ambulatory Visit (INDEPENDENT_AMBULATORY_CARE_PROVIDER_SITE_OTHER): Payer: Managed Care, Other (non HMO) | Admitting: Psychiatry

## 2012-06-24 ENCOUNTER — Encounter (HOSPITAL_COMMUNITY): Payer: Self-pay | Admitting: Psychiatry

## 2012-06-24 ENCOUNTER — Other Ambulatory Visit (HOSPITAL_COMMUNITY): Payer: Self-pay | Admitting: *Deleted

## 2012-06-24 ENCOUNTER — Telehealth (INDEPENDENT_AMBULATORY_CARE_PROVIDER_SITE_OTHER): Payer: Self-pay

## 2012-06-24 ENCOUNTER — Other Ambulatory Visit (HOSPITAL_COMMUNITY): Payer: Self-pay | Admitting: Psychiatry

## 2012-06-24 DIAGNOSIS — F319 Bipolar disorder, unspecified: Secondary | ICD-10-CM

## 2012-06-24 DIAGNOSIS — F3162 Bipolar disorder, current episode mixed, moderate: Secondary | ICD-10-CM

## 2012-06-24 MED ORDER — QUETIAPINE FUMARATE 50 MG PO TABS: ORAL_TABLET | ORAL | Status: DC
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
## 2012-06-24 NOTE — Telephone Encounter (Signed)
90 day supply authorized by Dr.Arfeen

## 2012-06-24 NOTE — Telephone Encounter (Signed)
Refill for Diphen/Atropine approval faxed to [facility] @ [phone], confirmation rec'd

## 2012-06-29 ENCOUNTER — Ambulatory Visit (HOSPITAL_COMMUNITY): Payer: Self-pay | Admitting: Psychiatry

## 2012-06-30 ENCOUNTER — Encounter (HOSPITAL_COMMUNITY): Payer: Self-pay | Admitting: Psychiatry

## 2012-06-30 ENCOUNTER — Ambulatory Visit (INDEPENDENT_AMBULATORY_CARE_PROVIDER_SITE_OTHER): Payer: Managed Care, Other (non HMO) | Admitting: Psychiatry

## 2012-06-30 DIAGNOSIS — F3162 Bipolar disorder, current episode mixed, moderate: Secondary | ICD-10-CM
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
## 2012-07-13 ENCOUNTER — Ambulatory Visit (HOSPITAL_COMMUNITY): Payer: Self-pay | Admitting: Psychiatry

## 2012-07-13 ENCOUNTER — Encounter (HOSPITAL_COMMUNITY): Payer: Self-pay | Admitting: Psychiatry

## 2012-07-13 ENCOUNTER — Ambulatory Visit (INDEPENDENT_AMBULATORY_CARE_PROVIDER_SITE_OTHER): Payer: Managed Care, Other (non HMO) | Admitting: Psychiatry

## 2012-07-13 DIAGNOSIS — F3162 Bipolar disorder, current episode mixed, moderate: Secondary | ICD-10-CM

## 2012-07-13 NOTE — Progress Notes (Signed)
   THERAPIST PROGRESS NOTE  Session Time: 8:40-9:30  Participation Level: Active  Behavioral Response: CasualAlertEuthymic  Type of Therapy: Individual Therapy  Treatment Goals addressed: coping, communication skills  Interventions: CBT  Summary: [name] is a 32 y.o. female who presents with bipolar.   Suicidal/Homicidal: Nowithout intent/plan  Therapist Response: Pt. Processed recent communications at work, discussed dissatisfaction at work and Surveyor, mining.  Plan: Return again in 1-2 weeks.  Diagnosis: Axis I: Bipolar, mixed    Axis II: No diagnosis    [name] 07/13/2012

## 2012-07-15 ENCOUNTER — Ambulatory Visit (HOSPITAL_COMMUNITY): Payer: Self-pay | Admitting: Psychiatry

## 2012-07-20 ENCOUNTER — Ambulatory Visit (HOSPITAL_COMMUNITY): Payer: Self-pay | Admitting: Psychiatry

## 2012-07-22 ENCOUNTER — Ambulatory Visit (INDEPENDENT_AMBULATORY_CARE_PROVIDER_SITE_OTHER): Payer: Managed Care, Other (non HMO) | Admitting: Psychiatry

## 2012-07-22 ENCOUNTER — Encounter (HOSPITAL_COMMUNITY): Payer: Self-pay | Admitting: Psychiatry

## 2012-07-22 DIAGNOSIS — F32A Depression, unspecified: Secondary | ICD-10-CM

## 2012-07-22 DIAGNOSIS — F3289 Other specified depressive episodes: Secondary | ICD-10-CM

## 2012-07-22 DIAGNOSIS — F329 Major depressive disorder, single episode, unspecified: Secondary | ICD-10-CM

## 2012-07-22 NOTE — Progress Notes (Signed)
   THERAPIST PROGRESS NOTE  Session Time: 8:30-9:20  Participation Level: Active  Behavioral Response: CasualAlertEuthymic  Type of Therapy: Individual Therapy  Treatment Goals addressed: communication  Interventions: CBT  Summary: [name] is a 32 y.o. female who presents with depression.   Suicidal/Homicidal: Nowithout intent/plan  Therapist Response: Discussed recent family interactions, awareness that Pt. And husband need to strengthen their communication.  Plan: Return again in 1-2 weeks.  Diagnosis: Axis I: Depressive Disorder NOS    Axis II: No diagnosis    [name] 07/22/2012

## 2012-07-27 ENCOUNTER — Ambulatory Visit (HOSPITAL_COMMUNITY): Payer: Self-pay | Admitting: Psychiatry

## 2012-07-28 ENCOUNTER — Ambulatory Visit (INDEPENDENT_AMBULATORY_CARE_PROVIDER_SITE_OTHER): Payer: Managed Care, Other (non HMO) | Admitting: Psychiatry

## 2012-07-28 DIAGNOSIS — F3162 Bipolar disorder, current episode mixed, moderate: Secondary | ICD-10-CM

## 2012-07-29 ENCOUNTER — Encounter (HOSPITAL_COMMUNITY): Payer: Self-pay | Admitting: Psychiatry

## 2012-07-29 NOTE — Progress Notes (Signed)
   THERAPIST PROGRESS NOTE  Session Time: 8:30-9:20  Participation Level: Active  Behavioral Response: CasualAlertEuthymic  Type of Therapy: Individual Therapy  Treatment Goals addressed: coping skills, communication skills  Interventions: CBT  Summary: [name] is a 32 y.o. female who presents with bipolar disorder.   Suicidal/Homicidal: Nowithout intent/plan  Therapist Response: Pt. Reports significant improvement in mood, greater ability to depersonalize events and manage stressors without unmanageable irritability and anger. Focused on themes related to ability to set healthy boundaries regarding family obligations and exploration of career options.  Plan: Return again in 2-3 weeks.  Diagnosis: Axis I: Bipolar, mixed    Axis II: No diagnosis    [name] 07/29/2012

## 2012-08-03 ENCOUNTER — Telehealth (HOSPITAL_COMMUNITY): Payer: Self-pay

## 2012-08-03 MED ORDER — BUPROPION HCL ER (XL) 150 MG PO TB24: 150.0000 mg | ORAL_TABLET | ORAL | Status: DC

## 2012-08-03 NOTE — Telephone Encounter (Signed)
Call r/t and Left message.

## 2012-08-03 NOTE — Addendum Note (Signed)
Addended by: Kathryne Sharper T on: 08/03/2012 05:08 PM   Modules accepted: Orders

## 2012-08-03 NOTE — Telephone Encounter (Signed)
Patient complaining of decreased focus and attention.  She has difficulty paying attention at work.  She wants to try the medication that can help her focus and attention.  She's taking Lamictal 150 mg twice a day, Prozac 20 mg twice a day and Seroquel 1/4th of 50 mg.  I explained that adding stimulant may cause irritability and anger.  However patient like to try anything that can help focus and attention.  I will try Wellbutrin XL 150 mg daily and recommend to cut down Prozac 20 mg daily.  I explained that if she started noticing any irritability anger that she should call us immediately.  She will see therapist in 2 weeks and give [place] to update.

## 2012-08-10 ENCOUNTER — Other Ambulatory Visit (HOSPITAL_COMMUNITY): Payer: Self-pay | Admitting: Psychiatry

## 2012-08-10 DIAGNOSIS — F3162 Bipolar disorder, current episode mixed, moderate: Secondary | ICD-10-CM

## 2012-08-11 ENCOUNTER — Other Ambulatory Visit (HOSPITAL_COMMUNITY): Payer: Self-pay | Admitting: *Deleted

## 2012-08-12 ENCOUNTER — Other Ambulatory Visit (HOSPITAL_COMMUNITY): Payer: Self-pay | Admitting: Psychiatry

## 2012-08-12 DIAGNOSIS — F319 Bipolar disorder, unspecified: Secondary | ICD-10-CM

## 2012-08-13 ENCOUNTER — Other Ambulatory Visit (INDEPENDENT_AMBULATORY_CARE_PROVIDER_SITE_OTHER): Payer: Self-pay

## 2012-08-13 DIAGNOSIS — R197 Diarrhea, unspecified: Secondary | ICD-10-CM

## 2012-08-13 MED ORDER — DIPHENOXYLATE-ATROPINE 2.5-0.025 MG PO TABS: ORAL_TABLET | ORAL | Status: DC

## 2012-08-13 NOTE — Progress Notes (Signed)
Refill request for Diphen/Atropine (Lomotil) tablet rec'd from [facility].  Approved w/3 refills per Dr. Johna Sheriff.  Faxed signed refill approval back to [facility] @ [phone].  Fax confirmation rec'd.

## 2012-08-18 ENCOUNTER — Encounter (HOSPITAL_COMMUNITY): Payer: Self-pay | Admitting: Psychiatry

## 2012-08-18 ENCOUNTER — Ambulatory Visit (INDEPENDENT_AMBULATORY_CARE_PROVIDER_SITE_OTHER): Payer: Managed Care, Other (non HMO) | Admitting: Psychiatry

## 2012-08-18 DIAGNOSIS — F3162 Bipolar disorder, current episode mixed, moderate: Secondary | ICD-10-CM

## 2012-08-18 NOTE — Progress Notes (Signed)
   THERAPIST PROGRESS NOTE  Session Time: 8:00-8:50  Participation Level: Active  Behavioral Response: CasualAlertEuthymic  Type of Therapy: Individual Therapy  Treatment Goals addressed: stress management, communication skills  Interventions: CBT  Summary: [name] is a 32 y.o. female who presents with bipolar disorder.   Suicidal/Homicidal: Nowithout intent/plan  Therapist Response: Pt. Focused on anticipating death and grieving loss of grandmother who was moved to nursing home. Pt. Reported that she is managing working stress better.  Plan: Return again in 2 weeks.  Diagnosis: Axis I: Bipolar, mixed    Axis II: No diagnosis    [name] 08/18/2012

## 2012-08-19 ENCOUNTER — Ambulatory Visit (HOSPITAL_COMMUNITY): Payer: Self-pay | Admitting: Psychiatry

## 2012-08-27 ENCOUNTER — Other Ambulatory Visit (HOSPITAL_COMMUNITY): Payer: Self-pay | Admitting: Psychiatry

## 2012-08-27 DIAGNOSIS — F319 Bipolar disorder, unspecified: Secondary | ICD-10-CM

## 2012-09-02 ENCOUNTER — Encounter (HOSPITAL_COMMUNITY): Payer: Self-pay | Admitting: Psychiatry

## 2012-09-02 ENCOUNTER — Ambulatory Visit (INDEPENDENT_AMBULATORY_CARE_PROVIDER_SITE_OTHER): Payer: Managed Care, Other (non HMO) | Admitting: Psychiatry

## 2012-09-02 DIAGNOSIS — F3162 Bipolar disorder, current episode mixed, moderate: Secondary | ICD-10-CM

## 2012-09-02 NOTE — Progress Notes (Signed)
   THERAPIST PROGRESS NOTE  Session Time: 8:00-8:50  Participation Level: Active  Behavioral Response: CasualAlertEuthymic  Type of Therapy: Individual Therapy  Treatment Goals addressed: stress management, career planning  Interventions: CBT  Summary: [name] is a 32 y.o. female who presents with depression.   Suicidal/Homicidal: Nowithout intent/plan  Therapist Response: Pt. Indicated significant improvement in ability to cope with stress; discussed strengths and career planning.  Plan: Return again in 2 weeks.  Diagnosis: Axis I: Bipolar, mixed    Axis II: No diagnosis    [name] 09/02/2012

## 2012-09-08 ENCOUNTER — Ambulatory Visit (HOSPITAL_COMMUNITY): Payer: Self-pay | Admitting: Psychiatry

## 2012-09-15 ENCOUNTER — Encounter (HOSPITAL_COMMUNITY): Payer: Self-pay | Admitting: Psychiatry

## 2012-09-15 ENCOUNTER — Ambulatory Visit (INDEPENDENT_AMBULATORY_CARE_PROVIDER_SITE_OTHER): Payer: Managed Care, Other (non HMO) | Admitting: Psychiatry

## 2012-09-15 DIAGNOSIS — F3162 Bipolar disorder, current episode mixed, moderate: Secondary | ICD-10-CM

## 2012-09-15 NOTE — Progress Notes (Signed)
   THERAPIST PROGRESS NOTE  Session Time: 8:00-8:50  Participation Level: Active  Behavioral Response: CasualAlertEuthymic  Type of Therapy: Individual Therapy  Treatment Goals addressed: emotion regulation, stress management  Interventions: CBT  Summary: [name] is a 32 y.o. female who presents with bipolar disorder.   Suicidal/Homicidal: Nowithout intent/plan  Therapist Response: Pt. Processed career and communication issues in relationship with husband.  Plan: Return again in 2 weeks.  Diagnosis: Axis I: Bipolar, Depressed    Axis II: No diagnosis    [name] 09/15/2012

## 2012-09-23 ENCOUNTER — Telehealth (HOSPITAL_COMMUNITY): Payer: Self-pay | Admitting: Psychiatry

## 2012-09-23 ENCOUNTER — Telehealth (HOSPITAL_COMMUNITY): Payer: Self-pay

## 2012-09-23 NOTE — Telephone Encounter (Signed)
Call returns and left a message.  Recommend to call us back.

## 2012-09-27 ENCOUNTER — Ambulatory Visit (INDEPENDENT_AMBULATORY_CARE_PROVIDER_SITE_OTHER): Payer: Managed Care, Other (non HMO) | Admitting: Psychiatry

## 2012-09-27 ENCOUNTER — Encounter (HOSPITAL_COMMUNITY): Payer: Self-pay | Admitting: Psychiatry

## 2012-09-27 VITALS — BP 114/78 | HR 90 | Ht 61.75 in | Wt 135.0 lb

## 2012-09-27 DIAGNOSIS — F3130 Bipolar disorder, current episode depressed, mild or moderate severity, unspecified: Secondary | ICD-10-CM

## 2012-09-27 DIAGNOSIS — F3162 Bipolar disorder, current episode mixed, moderate: Secondary | ICD-10-CM

## 2012-09-27 DIAGNOSIS — F988 Other specified behavioral and emotional disorders with onset usually occurring in childhood and adolescence: Secondary | ICD-10-CM

## 2012-09-27 MED ORDER — LISDEXAMFETAMINE DIMESYLATE 20 MG PO CAPS: 20.0000 mg | ORAL_CAPSULE | ORAL | Status: DC

## 2012-09-27 MED ORDER — ALPRAZOLAM 0.25 MG PO TABS: ORAL_TABLET | ORAL | Status: DC
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
## 2012-09-28 ENCOUNTER — Encounter (HOSPITAL_COMMUNITY): Payer: Self-pay | Admitting: *Deleted

## 2012-09-28 NOTE — Progress Notes (Signed)
Vyvanse authorized by [employer] - Effective 08/29/12 thru 09/28/13. Auth #[id number]

## 2012-09-29 ENCOUNTER — Ambulatory Visit (INDEPENDENT_AMBULATORY_CARE_PROVIDER_SITE_OTHER): Payer: Managed Care, Other (non HMO) | Admitting: Psychiatry

## 2012-09-29 ENCOUNTER — Encounter (HOSPITAL_COMMUNITY): Payer: Self-pay | Admitting: Psychiatry

## 2012-09-29 DIAGNOSIS — F39 Unspecified mood [affective] disorder: Secondary | ICD-10-CM

## 2012-09-29 NOTE — Progress Notes (Signed)
   THERAPIST PROGRESS NOTE  Session Time: 8:00-8:50  Participation Level: Active  Behavioral Response: CasualAlertEuthymic  Type of Therapy: Individual Therapy  Treatment Goals addressed: emotion regulation  Interventions: CBT  Summary: [name] is a 32 y.o. female who presents with bipolar disorder, mixed.   Suicidal/Homicidal: Nowithout intent/plan  Therapist Response: Pt. Reported medication change by Dr. Lolly Mustache. Trying vyvanse to see if she has less lethargy than experienced with welbutrin. Pt. Reports that communication has improved with husband, feeling more motivation at work, focused on developing resume and finding new employment, looking forward to family vacation. Pt. Reported developing insight into causes of irritability and frustration.  Plan: Return again in 2 weeks.  Diagnosis: Axis I: Depressive Disorder NOS    Axis II: No diagnosis    [name] 09/29/2012

## 2012-10-11 ENCOUNTER — Encounter (HOSPITAL_COMMUNITY): Payer: Self-pay | Admitting: Psychiatry

## 2012-10-11 ENCOUNTER — Ambulatory Visit (INDEPENDENT_AMBULATORY_CARE_PROVIDER_SITE_OTHER): Payer: Managed Care, Other (non HMO) | Admitting: Psychiatry

## 2012-10-11 DIAGNOSIS — F3289 Other specified depressive episodes: Secondary | ICD-10-CM

## 2012-10-11 DIAGNOSIS — F32A Depression, unspecified: Secondary | ICD-10-CM

## 2012-10-11 DIAGNOSIS — F329 Major depressive disorder, single episode, unspecified: Secondary | ICD-10-CM

## 2012-10-11 NOTE — Progress Notes (Signed)
   THERAPIST PROGRESS NOTE  Session Time: 8:00-9:00  Participation Level: Active  Behavioral Response: CasualAlertEuthymic  Type of Therapy: Individual Therapy  Treatment Goals addressed: emotion regulation, anxiety, communication skills  Interventions: CBT  Summary: [name] is a 32 y.o. female who presents with depression.   Suicidal/Homicidal: Nowithout intent/plan  Therapist Response: Pt. Processed frustration, anger related to current employment. Processed themes related to values (i.e., professionalism, excellence/productivity, commitment, service).  Plan: Return again in 3 weeks.  Diagnosis: Axis I: Depressive Disorder NOS    Axis II: No diagnosis    [name] 10/11/2012

## 2012-10-12 ENCOUNTER — Telehealth (INDEPENDENT_AMBULATORY_CARE_PROVIDER_SITE_OTHER): Payer: Self-pay

## 2012-10-12 DIAGNOSIS — R197 Diarrhea, unspecified: Secondary | ICD-10-CM

## 2012-10-12 NOTE — Telephone Encounter (Signed)
Patient called in still with diarrhea symptoms. She has not had any lomotil for some time. She states she has been steady with a weight of 145 lbs for a year until June. In the past 2 months she has lost 20+ lbs. She is worried with the amount of weight she has lost. She states she has been eating lots of fruits, drinking lots of water and walking a lot which is something she didn't do as much in the past. She also said when she eats, she has a BM within 10- 15 minutes and a lot of the time the BM consists of the undigested food she ate. Especially when she eats salad her BM will be mainly lettuce. Advised it might be helpful to keep a diary of the foods she eats from now until her yearly check up on 9/11 to make sure she is getting enough of the nutrients she needs by the foods she eat.  She is going on a cruise in a week and is worried with the diarrhea. I explained I will send Dr Johna Sheriff a refill request of lomotil and we will let her know if it has been refilled.

## 2012-10-12 NOTE — Telephone Encounter (Signed)
Please call in Rx for lomotil

## 2012-10-13 ENCOUNTER — Ambulatory Visit (HOSPITAL_COMMUNITY): Payer: Self-pay | Admitting: Psychiatry

## 2012-10-13 MED ORDER — DIPHENOXYLATE-ATROPINE 2.5-0.025 MG PO TABS: ORAL_TABLET | ORAL | Status: DC

## 2012-10-13 NOTE — Telephone Encounter (Signed)
Pt told prescription is being called in.

## 2012-10-13 NOTE — Addendum Note (Signed)
Addended by: Brennan Bailey on: 10/13/2012 09:17 AM   Modules accepted: Orders

## 2012-10-14 ENCOUNTER — Ambulatory Visit (INDEPENDENT_AMBULATORY_CARE_PROVIDER_SITE_OTHER): Payer: Managed Care, Other (non HMO) | Admitting: Psychiatry

## 2012-10-14 ENCOUNTER — Ambulatory Visit (HOSPITAL_COMMUNITY): Payer: Self-pay | Admitting: Psychiatry

## 2012-10-14 ENCOUNTER — Encounter (HOSPITAL_COMMUNITY): Payer: Self-pay | Admitting: Psychiatry

## 2012-10-14 VITALS — BP 133/85 | HR 103 | Ht 61.75 in | Wt 134.0 lb

## 2012-10-14 DIAGNOSIS — F3130 Bipolar disorder, current episode depressed, mild or moderate severity, unspecified: Secondary | ICD-10-CM

## 2012-10-14 DIAGNOSIS — F988 Other specified behavioral and emotional disorders with onset usually occurring in childhood and adolescence: Secondary | ICD-10-CM

## 2012-10-14 MED ORDER — LISDEXAMFETAMINE DIMESYLATE 30 MG PO CAPS: 30.0000 mg | ORAL_CAPSULE | ORAL | Status: DC
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
## 2012-10-18 ENCOUNTER — Ambulatory Visit (HOSPITAL_COMMUNITY): Payer: Self-pay | Admitting: Psychiatry

## 2012-10-20 ENCOUNTER — Other Ambulatory Visit (INDEPENDENT_AMBULATORY_CARE_PROVIDER_SITE_OTHER): Payer: Self-pay

## 2012-10-20 DIAGNOSIS — R110 Nausea: Secondary | ICD-10-CM

## 2012-10-20 MED ORDER — ONDANSETRON HCL 4 MG PO TABS: 4.0000 mg | ORAL_TABLET | Freq: Three times a day (TID) | ORAL | Status: DC | PRN

## 2012-10-27 ENCOUNTER — Other Ambulatory Visit (HOSPITAL_COMMUNITY): Payer: Self-pay | Admitting: Psychiatry

## 2012-10-28 ENCOUNTER — Other Ambulatory Visit (HOSPITAL_COMMUNITY): Payer: Self-pay | Admitting: Psychiatry

## 2012-10-28 NOTE — Telephone Encounter (Signed)
It was disconitnued

## 2012-11-02 ENCOUNTER — Other Ambulatory Visit (INDEPENDENT_AMBULATORY_CARE_PROVIDER_SITE_OTHER): Payer: Self-pay

## 2012-11-02 ENCOUNTER — Telehealth (INDEPENDENT_AMBULATORY_CARE_PROVIDER_SITE_OTHER): Payer: Self-pay

## 2012-11-02 DIAGNOSIS — K912 Postsurgical malabsorption, not elsewhere classified: Secondary | ICD-10-CM

## 2012-11-02 DIAGNOSIS — Z09 Encounter for follow-up examination after completed treatment for conditions other than malignant neoplasm: Secondary | ICD-10-CM

## 2012-11-02 NOTE — Telephone Encounter (Signed)
Called and left patient a message regarding upcoming appointment.  Yearly lab orders have been ordered.  If patient can go to [facility] prior to office visit on 11/04/12 w/Dr. Johna Sheriff if possible.

## 2012-11-03 ENCOUNTER — Other Ambulatory Visit (HOSPITAL_COMMUNITY): Payer: Self-pay | Admitting: Psychiatry

## 2012-11-04 ENCOUNTER — Other Ambulatory Visit (HOSPITAL_COMMUNITY): Payer: Self-pay | Admitting: Psychiatry

## 2012-11-04 ENCOUNTER — Other Ambulatory Visit (HOSPITAL_COMMUNITY): Payer: Self-pay | Admitting: *Deleted

## 2012-11-04 ENCOUNTER — Encounter (HOSPITAL_COMMUNITY): Payer: Self-pay | Admitting: Psychiatry

## 2012-11-04 ENCOUNTER — Ambulatory Visit (INDEPENDENT_AMBULATORY_CARE_PROVIDER_SITE_OTHER): Payer: Managed Care, Other (non HMO) | Admitting: Psychiatry

## 2012-11-04 ENCOUNTER — Encounter (INDEPENDENT_AMBULATORY_CARE_PROVIDER_SITE_OTHER): Payer: Self-pay | Admitting: General Surgery

## 2012-11-04 ENCOUNTER — Ambulatory Visit (INDEPENDENT_AMBULATORY_CARE_PROVIDER_SITE_OTHER): Payer: Managed Care, Other (non HMO) | Admitting: General Surgery

## 2012-11-04 VITALS — BP 114/72 | Temp 97.2°F | Resp 16 | Ht 61.0 in | Wt 129.8 lb

## 2012-11-04 DIAGNOSIS — Z9884 Bariatric surgery status: Secondary | ICD-10-CM

## 2012-11-04 DIAGNOSIS — F3289 Other specified depressive episodes: Secondary | ICD-10-CM

## 2012-11-04 DIAGNOSIS — F988 Other specified behavioral and emotional disorders with onset usually occurring in childhood and adolescence: Secondary | ICD-10-CM

## 2012-11-04 DIAGNOSIS — F329 Major depressive disorder, single episode, unspecified: Secondary | ICD-10-CM

## 2012-11-04 DIAGNOSIS — F319 Bipolar disorder, unspecified: Secondary | ICD-10-CM

## 2012-11-04 DIAGNOSIS — F32A Depression, unspecified: Secondary | ICD-10-CM

## 2012-11-04 MED ORDER — FLUOXETINE HCL 20 MG PO CAPS: ORAL_CAPSULE | ORAL | Status: DC

## 2012-11-04 MED ORDER — LAMOTRIGINE 150 MG PO TABS: ORAL_TABLET | ORAL | Status: DC

## 2012-11-04 MED ORDER — LISDEXAMFETAMINE DIMESYLATE 30 MG PO CAPS: 30.0000 mg | ORAL_CAPSULE | ORAL | Status: DC

## 2012-11-04 NOTE — Progress Notes (Signed)
   THERAPIST PROGRESS NOTE  Session Time: 2:30-3:20  Participation Level: Active  Behavioral Response: CasualAlertEuthymic  Type of Therapy: Individual Therapy  Treatment Goals addressed: Emotion regulation, stress management  Interventions: CBT  Summary: [name] is a 32 y.o. female who presents with depression, anxiety.   Suicidal/Homicidal: Nowithout intent/plan  Therapist Observations/Response: Pt. Presented with positive mood, energetic. Pt. Reported events of recent cruise to [place]. Pt. Reported concern about father's health and making plans for intervention regarding his health.   Plan: Return again in 2 weeks.  Diagnosis: Axis I: Depressive Disorder NOS    Axis II: No diagnosis    [name] 11/04/2012

## 2012-11-04 NOTE — Progress Notes (Signed)
History: Patient returns to the office now 2-1/2 years after laparoscopic conversion of LAP-BAND 2 Roux-en-Y gastric bypass. She continues to do very well. She is actually managed to lose approximately 24 pounds in the last year it has now had normal weight. She continues to have diarrhea which is a little troublesome but generally controlled well with medications. She has appropriate restriction. No abnormal pain or vomiting. Her energy level is excellent. She is somewhat concerned about some excess skin of her abdomen.  Past Medical History  Diagnosis Date  . Anxiety   . Depression    Past Surgical History  Procedure Laterality Date  . Gastric bypass  2012  . Laparoscopic gastric banding  2009  . Gastric band removal  2011  . Cholecystectomy    . Cesarean section     Current Outpatient Prescriptions  Medication Sig Dispense Refill  . ALPRAZolam (XANAX) 0.25 MG tablet TAKE 1 TABLET AS NEEDED  30 tablet  0  . diphenoxylate-atropine (LOMOTIL) 2.5-0.025 MG per tablet Take 1 to 2 tablets T.I.D, prn for diarrhea, #90, prn refills  90 tablet  3  . etonogestrel-ethinyl estradiol (NUVARING) 0.12-0.015 MG/24HR vaginal ring Place 1 each vaginally every 28 (twenty-eight) days. Insert vaginally and leave in place for 3 consecutive weeks, then remove for 1 week.       Marland Kitchen FLUoxetine (PROZAC) 20 MG capsule TAKE 1 CAPSULE BY MOUTH 2 TIMES DAILY.  180 capsule  0  . lamoTRIgine (LAMICTAL) 150 MG tablet TAKE 1 TABLET BY MOUTH 2 TIMES DAILY.  180 tablet  0  . lisdexamfetamine (VYVANSE) 30 MG capsule Take 1 capsule (30 mg total) by mouth every morning.  30 capsule  0  . ondansetron (ZOFRAN) 4 MG tablet Take 1 tablet (4 mg total) by mouth every 8 (eight) hours as needed for nausea.  20 tablet  1  . QUEtiapine (SEROQUEL) 50 MG tablet Take 1/2 to 1 tab at bed time  90 tablet  0   No current facility-administered medications for this visit.   No Known Allergies  Exam: Vital signs are all within normal limits.  Weight is 130 pounds, 76 pound weight loss from preop. Gen.: Appears well Skin: No rash or infection Lungs: Clear equal breath sounds Cardiac: Regular rate and rhythm. No edema Abdomen: Soft and nontender. No hernias.  Assessment and plan: Doing very well following gastric bypass with excellent weight loss and no complications identified. She has lab work done regularly by Dr. Timothy Lasso. Will call as needed otherwise see her in one year.

## 2012-11-04 NOTE — Patient Instructions (Addendum)
Dr Etter Sjogren Dr Wayland Denis

## 2012-11-18 ENCOUNTER — Ambulatory Visit (INDEPENDENT_AMBULATORY_CARE_PROVIDER_SITE_OTHER): Payer: Managed Care, Other (non HMO) | Admitting: Psychiatry

## 2012-11-18 ENCOUNTER — Encounter (HOSPITAL_COMMUNITY): Payer: Self-pay | Admitting: Psychiatry

## 2012-11-18 DIAGNOSIS — F3162 Bipolar disorder, current episode mixed, moderate: Secondary | ICD-10-CM

## 2012-11-18 DIAGNOSIS — F3289 Other specified depressive episodes: Secondary | ICD-10-CM

## 2012-11-18 DIAGNOSIS — F329 Major depressive disorder, single episode, unspecified: Secondary | ICD-10-CM

## 2012-11-18 NOTE — Progress Notes (Signed)
Patient ID: Tilden Dome, female   DOB: 26-Jun-1980, 32 y.o.   MRN: [id number]  THERAPIST PROGRESS NOTE  Session Time: 9:00-9:50   Participation Level: Active   Behavioral Response: CasualAlertEuthymic   Type of Therapy: Individual Therapy   Treatment Goals addressed: Emotion regulation, stress management   Interventions: CBT   Summary: [name] is a 32 y.o. female who presents with depression, anxiety.   Suicidal/Homicidal: Nowithout intent/plan   Therapist Observations/Response: Pt. Continues to present with with positive mood, energetic. Pt. Reported events of visit with great grandmother who is in nursing home. Discussed decision making regarding plastic surgery subsequent to bariatric surgery.   Plan: Return again in 2 weeks.   Diagnosis: Axis I: Depressive Disorder NOS   Axis II: No diagnosis  [name]  11/18/2012

## 2012-11-22 ENCOUNTER — Telehealth (HOSPITAL_COMMUNITY): Payer: Self-pay | Admitting: *Deleted

## 2012-11-22 NOTE — Telephone Encounter (Signed)
See contact notes. Advised pt to discuss having FMLA changed with MD at appt on 10/2.

## 2012-11-23 ENCOUNTER — Telehealth (HOSPITAL_COMMUNITY): Payer: Self-pay

## 2012-11-25 ENCOUNTER — Encounter (HOSPITAL_COMMUNITY): Payer: Self-pay | Admitting: Psychiatry

## 2012-11-25 ENCOUNTER — Ambulatory Visit (INDEPENDENT_AMBULATORY_CARE_PROVIDER_SITE_OTHER): Payer: Managed Care, Other (non HMO) | Admitting: Psychiatry

## 2012-11-25 VITALS — BP 120/87 | HR 92 | Ht 61.75 in | Wt 129.4 lb

## 2012-11-25 DIAGNOSIS — F988 Other specified behavioral and emotional disorders with onset usually occurring in childhood and adolescence: Secondary | ICD-10-CM

## 2012-11-25 DIAGNOSIS — F3130 Bipolar disorder, current episode depressed, mild or moderate severity, unspecified: Secondary | ICD-10-CM

## 2012-11-25 MED ORDER — LISDEXAMFETAMINE DIMESYLATE 30 MG PO CAPS: 30.0000 mg | ORAL_CAPSULE | ORAL | Status: DC
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
## 2012-12-01 ENCOUNTER — Other Ambulatory Visit (INDEPENDENT_AMBULATORY_CARE_PROVIDER_SITE_OTHER): Payer: Self-pay

## 2012-12-01 DIAGNOSIS — K912 Postsurgical malabsorption, not elsewhere classified: Secondary | ICD-10-CM

## 2012-12-01 DIAGNOSIS — Z09 Encounter for follow-up examination after completed treatment for conditions other than malignant neoplasm: Secondary | ICD-10-CM

## 2012-12-02 ENCOUNTER — Ambulatory Visit (INDEPENDENT_AMBULATORY_CARE_PROVIDER_SITE_OTHER): Payer: Managed Care, Other (non HMO) | Admitting: Psychiatry

## 2012-12-02 ENCOUNTER — Encounter (HOSPITAL_COMMUNITY): Payer: Self-pay | Admitting: Psychiatry

## 2012-12-02 DIAGNOSIS — F3289 Other specified depressive episodes: Secondary | ICD-10-CM

## 2012-12-02 DIAGNOSIS — F3162 Bipolar disorder, current episode mixed, moderate: Secondary | ICD-10-CM

## 2012-12-02 DIAGNOSIS — F329 Major depressive disorder, single episode, unspecified: Secondary | ICD-10-CM

## 2012-12-02 NOTE — Progress Notes (Signed)
Patient ID: [name], female   DOB: 21-Jul-1980, 32 y.o.   MRN: [id number]  THERAPIST PROGRESS NOTE  Session Time: 9:00-9:50   Participation Level: Active   Behavioral Response: CasualAlertEuthymic   Type of Therapy: Individual Therapy   Treatment Goals addressed: Emotion regulation, stress management   Interventions: CBT   Summary: [name] is a 32 y.o. female who presents with depression, anxiety.   Suicidal/Homicidal: Nowithout intent/plan   Therapist Observations/Response: Pt. Continues to present with with positive mood, energetic. Pt. Processed career related decisions and her fears about confronting her father about concerns about his health.   Plan: Return again in 2 weeks .  Diagnosis: Axis I: Depressive Disorder NOS   Axis II: No diagnosis   [name]  12/02/2012

## 2012-12-03 ENCOUNTER — Other Ambulatory Visit: Payer: Self-pay | Admitting: Internal Medicine

## 2012-12-03 DIAGNOSIS — R300 Dysuria: Secondary | ICD-10-CM

## 2012-12-03 DIAGNOSIS — N390 Urinary tract infection, site not specified: Secondary | ICD-10-CM

## 2012-12-03 LAB — VITAMIN B12: Vitamin B-12: 295 pg/mL (ref 211–911)

## 2012-12-03 LAB — IRON AND TIBC: %SAT: 26 % (ref 20–55)

## 2012-12-10 ENCOUNTER — Ambulatory Visit
Admission: RE | Admit: 2012-12-10 | Discharge: 2012-12-10 | Disposition: A | Discharge status: Home / Self Care | Payer: Managed Care, Other (non HMO) | Source: Ambulatory Visit | Attending: Internal Medicine | Admitting: Internal Medicine

## 2012-12-10 ENCOUNTER — Other Ambulatory Visit: Payer: Self-pay | Admitting: Internal Medicine

## 2012-12-10 DIAGNOSIS — N390 Urinary tract infection, site not specified: Secondary | ICD-10-CM

## 2012-12-10 DIAGNOSIS — R300 Dysuria: Secondary | ICD-10-CM

## 2012-12-16 ENCOUNTER — Encounter (INDEPENDENT_AMBULATORY_CARE_PROVIDER_SITE_OTHER): Payer: Self-pay

## 2012-12-16 ENCOUNTER — Encounter (HOSPITAL_COMMUNITY): Payer: Self-pay | Admitting: Psychiatry

## 2012-12-16 ENCOUNTER — Ambulatory Visit (INDEPENDENT_AMBULATORY_CARE_PROVIDER_SITE_OTHER): Payer: Managed Care, Other (non HMO) | Admitting: Psychiatry

## 2012-12-16 DIAGNOSIS — F3289 Other specified depressive episodes: Secondary | ICD-10-CM

## 2012-12-16 DIAGNOSIS — F3162 Bipolar disorder, current episode mixed, moderate: Secondary | ICD-10-CM

## 2012-12-16 DIAGNOSIS — F329 Major depressive disorder, single episode, unspecified: Secondary | ICD-10-CM

## 2012-12-16 NOTE — Progress Notes (Signed)
Patient ID: [name], female   DOB: 10/01/1980, 32 y.o.   MRN: [id number]  Session Time: 9:00-9:50   Participation Level: Active   Behavioral Response: CasualAlertEuthymic   Type of Therapy: Individual Therapy   Treatment Goals addressed: Emotion regulation, stress management   Interventions: CBT   Summary: [name] is a 32 y.o. female who presents with depression, anxiety.   Suicidal/Homicidal: Nowithout intent/plan   Therapist Observations/Response: Pt. Continues to present with with positive mood, energetic, enthusiasm over accepting new job within company and leaving department that was source of significant stress. Processed developing attitude of openness toward new opportunities.   Plan: Return again in 2 weeks  .  Diagnosis: Axis I: Depressive Disorder NOS   Axis II: No diagnosis   [name]  12/16/2012

## 2012-12-23 ENCOUNTER — Ambulatory Visit (HOSPITAL_COMMUNITY): Payer: Self-pay | Admitting: Psychiatry

## 2012-12-28 ENCOUNTER — Telehealth (INDEPENDENT_AMBULATORY_CARE_PROVIDER_SITE_OTHER): Payer: Self-pay | Admitting: *Deleted

## 2012-12-28 NOTE — Telephone Encounter (Signed)
[facility] called to get an update on a request for Zofran that they say they sent over.  Explained that Dr. Johna Sheriff is unavailable but I will send a message to him to make him aware that patient is requesting a refill of the Zofran.

## 2012-12-30 ENCOUNTER — Encounter (HOSPITAL_COMMUNITY): Payer: Self-pay | Admitting: Psychiatry

## 2012-12-30 ENCOUNTER — Other Ambulatory Visit (INDEPENDENT_AMBULATORY_CARE_PROVIDER_SITE_OTHER): Payer: Self-pay

## 2012-12-30 ENCOUNTER — Ambulatory Visit (INDEPENDENT_AMBULATORY_CARE_PROVIDER_SITE_OTHER): Payer: Managed Care, Other (non HMO) | Admitting: Psychiatry

## 2012-12-30 DIAGNOSIS — F329 Major depressive disorder, single episode, unspecified: Secondary | ICD-10-CM

## 2012-12-30 DIAGNOSIS — F32A Depression, unspecified: Secondary | ICD-10-CM

## 2012-12-30 DIAGNOSIS — F3289 Other specified depressive episodes: Secondary | ICD-10-CM

## 2012-12-30 DIAGNOSIS — R110 Nausea: Secondary | ICD-10-CM

## 2012-12-30 MED ORDER — ONDANSETRON HCL 4 MG PO TABS: 4.0000 mg | ORAL_TABLET | Freq: Three times a day (TID) | ORAL | Status: DC | PRN

## 2012-12-30 NOTE — Progress Notes (Signed)
Patient ID: [name], female   DOB: 04/09/1980, 32 y.o.   MRN: [id number]  Session Time: 9:00-9:50   Participation Level: Active   Behavioral Response: CasualAlertEuthymic   Type of Therapy: Individual Therapy   Treatment Goals addressed: Emotion regulation, stress management   Interventions: CBT   Summary: [name] is a 32 y.o. female who presents with depression, anxiety.   Suicidal/Homicidal: Nowithout intent/plan   Therapist Observations/Response: Pt. 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 Pt. Reports that she continues to have few days a week when she feels more tired than normal and low motivation for normal activities; however, on most days she feels energetic and positive about her life, work, and family responsibilities.  Plan: Return again in 2 weeks  .  Diagnosis: Axis I: Depressive Disorder NOS   Axis II: No diagnosis   [name]  12/30/2012

## 2012-12-30 NOTE — Progress Notes (Signed)
[facility] refills per Dr. Johna Sheriff.  [facility]ed to [facility] on 12/30/12

## 2012-12-31 NOTE — Telephone Encounter (Signed)
Okay per Dr. Johna Sheriff to refill Zofran.  Approval [facility]ed to [email or web address]

## 2013-01-10 ENCOUNTER — Other Ambulatory Visit (HOSPITAL_COMMUNITY): Payer: Self-pay | Admitting: *Deleted

## 2013-01-10 ENCOUNTER — Telehealth (HOSPITAL_COMMUNITY): Payer: Self-pay

## 2013-01-10 DIAGNOSIS — F988 Other specified behavioral and emotional disorders with onset usually occurring in childhood and adolescence: Secondary | ICD-10-CM

## 2013-01-10 MED ORDER — LISDEXAMFETAMINE DIMESYLATE 30 MG PO CAPS: 30.0000 mg | ORAL_CAPSULE | ORAL | Status: DC

## 2013-01-10 NOTE — Telephone Encounter (Signed)
4:32PM 01/10/13 Patient's husband came and pick-up rx script.Marland KitchenMarguerite Olea

## 2013-01-13 ENCOUNTER — Telehealth (INDEPENDENT_AMBULATORY_CARE_PROVIDER_SITE_OTHER): Payer: Self-pay

## 2013-01-13 ENCOUNTER — Other Ambulatory Visit (INDEPENDENT_AMBULATORY_CARE_PROVIDER_SITE_OTHER): Payer: Self-pay

## 2013-01-13 ENCOUNTER — Ambulatory Visit (INDEPENDENT_AMBULATORY_CARE_PROVIDER_SITE_OTHER): Payer: Managed Care, Other (non HMO) | Admitting: Psychiatry

## 2013-01-13 ENCOUNTER — Encounter (HOSPITAL_COMMUNITY): Payer: Self-pay | Admitting: Psychiatry

## 2013-01-13 DIAGNOSIS — R197 Diarrhea, unspecified: Secondary | ICD-10-CM

## 2013-01-13 DIAGNOSIS — F329 Major depressive disorder, single episode, unspecified: Secondary | ICD-10-CM

## 2013-01-13 DIAGNOSIS — F3289 Other specified depressive episodes: Secondary | ICD-10-CM

## 2013-01-13 DIAGNOSIS — F32A Depression, unspecified: Secondary | ICD-10-CM

## 2013-01-13 MED ORDER — DIPHENOXYLATE-ATROPINE 2.5-0.025 MG PO TABS: ORAL_TABLET | ORAL | Status: DC

## 2013-01-13 NOTE — Progress Notes (Signed)
Patient ID: [name], female   DOB: [birth year], 32 y.o.   MRN: [id number]  Session Time: 9:00-9:50  Participation Level: Active   Behavioral Response: CasualAlertEuthymic   Type of Therapy: Individual Therapy   Treatment Goals addressed: Emotion regulation, stress management   Interventions: CBT   Summary: [name] is a 32 y.o. female who presents with depression, anxiety.   Suicidal/Homicidal: Nowithout intent/plan   Therapist Observations/Response: Pt. Continues to report success with new job assignment. Pt. Reports positive change in mood and self-esteem with new position. Pt. Also reports that she is challenged to find motivation to perform household which she attributes partly to medication and belief that antidepressants are no longer working for her.  Plan: Return again in 2 weeks  .  Diagnosis: Axis I: Depressive Disorder NOS  Axis II: No diagnosis  [name]  01/13/2013

## 2013-01-13 NOTE — Telephone Encounter (Signed)
RX for Lomotil [facility]ed [facility] in [place] @ [phone]

## 2013-01-15 ENCOUNTER — Other Ambulatory Visit (HOSPITAL_COMMUNITY): Payer: Self-pay | Admitting: Psychiatry

## 2013-01-15 DIAGNOSIS — F3162 Bipolar disorder, current episode mixed, moderate: Secondary | ICD-10-CM

## 2013-01-17 NOTE — Telephone Encounter (Signed)
Refill authorized by Dr. Lolly Mustache

## 2013-01-26 ENCOUNTER — Ambulatory Visit (INDEPENDENT_AMBULATORY_CARE_PROVIDER_SITE_OTHER): Payer: Managed Care, Other (non HMO) | Admitting: Psychiatry

## 2013-01-26 ENCOUNTER — Encounter (HOSPITAL_COMMUNITY): Payer: Self-pay | Admitting: Psychiatry

## 2013-01-26 DIAGNOSIS — F988 Other specified behavioral and emotional disorders with onset usually occurring in childhood and adolescence: Secondary | ICD-10-CM

## 2013-01-26 DIAGNOSIS — F3130 Bipolar disorder, current episode depressed, mild or moderate severity, unspecified: Secondary | ICD-10-CM

## 2013-01-26 DIAGNOSIS — F319 Bipolar disorder, unspecified: Secondary | ICD-10-CM

## 2013-01-26 MED ORDER — ESCITALOPRAM OXALATE 10 MG PO TABS: 10.0000 mg | ORAL_TABLET | Freq: Every day | ORAL | Status: DC

## 2013-01-26 MED ORDER — LAMOTRIGINE 150 MG PO TABS: ORAL_TABLET | ORAL | Status: DC

## 2013-01-26 MED ORDER — LISDEXAMFETAMINE DIMESYLATE 30 MG PO CAPS: 30.0000 mg | ORAL_CAPSULE | ORAL | Status: DC
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
## 2013-01-27 ENCOUNTER — Ambulatory Visit (HOSPITAL_COMMUNITY): Payer: Self-pay | Admitting: Psychiatry

## 2013-02-01 ENCOUNTER — Telehealth (INDEPENDENT_AMBULATORY_CARE_PROVIDER_SITE_OTHER): Payer: Self-pay

## 2013-02-01 NOTE — Telephone Encounter (Signed)
She could have B12 injections if she needs them but as there are many other causes for fatigue she should check with her PCP and have lab work and evaluation done first.

## 2013-02-01 NOTE — Telephone Encounter (Signed)
Rec'd message from patient stating she has no energy.  She's currently taking 2 multi-vitamins and B-12 daily but still exhausted.  Patient wanted to know if she could get B12 injections from her PCP?  Patient wanted to know there was anything else she could take.  Patient aware that Dr. Jamse Mead not in the office this week but, I will send a message and call patient with recommendations.

## 2013-02-03 NOTE — Telephone Encounter (Signed)
Called and left message for patient to call our office to give recommendations from Dr. Johna Sheriff regarding B12 and fatigue.  Please refer to note from Dr. Johna Sheriff

## 2013-02-10 ENCOUNTER — Ambulatory Visit (INDEPENDENT_AMBULATORY_CARE_PROVIDER_SITE_OTHER): Payer: Managed Care, Other (non HMO) | Admitting: Psychiatry

## 2013-02-10 ENCOUNTER — Encounter (HOSPITAL_COMMUNITY): Payer: Self-pay | Admitting: Psychiatry

## 2013-02-10 DIAGNOSIS — F411 Generalized anxiety disorder: Secondary | ICD-10-CM | POA: Insufficient documentation

## 2013-02-10 DIAGNOSIS — F3289 Other specified depressive episodes: Secondary | ICD-10-CM

## 2013-02-10 DIAGNOSIS — F329 Major depressive disorder, single episode, unspecified: Secondary | ICD-10-CM

## 2013-02-10 NOTE — Progress Notes (Signed)
Patient ID: [name], female   DOB: 11/13/1980, 32 y.o.   MRN: [id number]  Session Time: 10:20-11:20   Participation Level: Active   Behavioral Response: CasualAlertEuthymic   Type of Therapy: Individual Therapy   Treatment Goals addressed: Emotion regulation, stress management   Interventions: CBT   Summary: [name] is a 32 y.o. female who presents with anxiety.   Suicidal/Homicidal: Nowithout intent/plan   Therapist Observations/Response: Pt. Presents somewhat lethargic, but talkative and reports positive mood. Pt. Reports that she was prescribed lexapro during last psychiatrist visit and feels that she is tolerating the medication well. Pt. 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 Pt. 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 Pt. Expressed desire to explore her parental relationships more deeply related to effects of father's alcohol and drug addiction and her mother's emotional disengagement as a child and adolescent.  Plan: Pt. To continue emotion regulation interventions throughout the day including breathwork and meditation. Return again in 2 weeks  .  Diagnosis: Axis I: Depressive Disorder NOS  Axis II: No diagnosis  [name]  02/10/2013

## 2013-02-23 ENCOUNTER — Telehealth (HOSPITAL_COMMUNITY): Payer: Self-pay | Admitting: *Deleted

## 2013-02-23 ENCOUNTER — Other Ambulatory Visit (HOSPITAL_COMMUNITY): Payer: Self-pay | Admitting: *Deleted

## 2013-02-23 DIAGNOSIS — F988 Other specified behavioral and emotional disorders with onset usually occurring in childhood and adolescence: Secondary | ICD-10-CM

## 2013-02-23 DIAGNOSIS — F319 Bipolar disorder, unspecified: Secondary | ICD-10-CM

## 2013-02-23 MED ORDER — LISDEXAMFETAMINE DIMESYLATE 30 MG PO CAPS: 30.0000 mg | ORAL_CAPSULE | ORAL | Status: DC

## 2013-02-23 MED ORDER — ESCITALOPRAM OXALATE 10 MG PO TABS: 10.0000 mg | ORAL_TABLET | Freq: Every day | ORAL | Status: DC

## 2013-02-23 NOTE — Telephone Encounter (Signed)
Takes last Lexapro pill tomorrow. Needs refill of Lexapro.Thought MD was going to increase it, so did not know if should get refill of 10 mg or not.Also wants a refill of Vyvanse to pick up at next appt with Victorino Dike

## 2013-02-25 ENCOUNTER — Telehealth (HOSPITAL_COMMUNITY): Payer: Self-pay

## 2013-03-03 ENCOUNTER — Ambulatory Visit (HOSPITAL_COMMUNITY): Payer: Self-pay | Admitting: Psychiatry

## 2013-03-03 ENCOUNTER — Ambulatory Visit (INDEPENDENT_AMBULATORY_CARE_PROVIDER_SITE_OTHER): Payer: Managed Care, Other (non HMO) | Admitting: Psychiatry

## 2013-03-03 DIAGNOSIS — F3289 Other specified depressive episodes: Secondary | ICD-10-CM

## 2013-03-03 DIAGNOSIS — F32A Depression, unspecified: Secondary | ICD-10-CM

## 2013-03-03 DIAGNOSIS — F329 Major depressive disorder, single episode, unspecified: Secondary | ICD-10-CM
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
## 2013-03-09 ENCOUNTER — Ambulatory Visit (INDEPENDENT_AMBULATORY_CARE_PROVIDER_SITE_OTHER): Payer: Managed Care, Other (non HMO) | Admitting: Psychiatry

## 2013-03-09 ENCOUNTER — Encounter (HOSPITAL_COMMUNITY): Payer: Self-pay | Admitting: Psychiatry

## 2013-03-09 VITALS — BP 129/89 | HR 93 | Ht 61.73 in | Wt 129.0 lb

## 2013-03-09 DIAGNOSIS — F988 Other specified behavioral and emotional disorders with onset usually occurring in childhood and adolescence: Secondary | ICD-10-CM

## 2013-03-09 DIAGNOSIS — F3130 Bipolar disorder, current episode depressed, mild or moderate severity, unspecified: Secondary | ICD-10-CM

## 2013-03-09 MED ORDER — LISDEXAMFETAMINE DIMESYLATE 20 MG PO CAPS: ORAL_CAPSULE | ORAL | Status: DC

## 2013-03-09 MED ORDER — HYDROXYZINE PAMOATE 25 MG PO CAPS: ORAL_CAPSULE | ORAL | Status: DC
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
## 2013-03-11 ENCOUNTER — Telehealth (HOSPITAL_COMMUNITY): Payer: Self-pay

## 2013-03-11 DIAGNOSIS — F3162 Bipolar disorder, current episode mixed, moderate: Secondary | ICD-10-CM

## 2013-03-15 ENCOUNTER — Ambulatory Visit (INDEPENDENT_AMBULATORY_CARE_PROVIDER_SITE_OTHER): Payer: Managed Care, Other (non HMO) | Admitting: Psychiatry

## 2013-03-15 DIAGNOSIS — F3289 Other specified depressive episodes: Secondary | ICD-10-CM

## 2013-03-15 DIAGNOSIS — F32A Depression, unspecified: Secondary | ICD-10-CM

## 2013-03-15 DIAGNOSIS — F329 Major depressive disorder, single episode, unspecified: Secondary | ICD-10-CM

## 2013-03-15 NOTE — Progress Notes (Signed)
Patient ID: [name], female   DOB: 03/17/1980, 33 y.o.   MRN: [id number]  Session Time: 9:00-9:50   Participation Level: Active   Behavioral Response: CasualAlertDysphoric  Type of Therapy: Individual Therapy   Treatment Goals addressed: Emotion regulation, stress management, boundary development   Interventions: CBT   Summary: [name] is a 33 y.o. female who presents with anxiety and depression.   Suicidal/Homicidal: Nowithout intent/plan   Therapist Observations/Response: Pt. Presents as talkative, complains of lethargy. Pt. Reports recent disappointment in communication with husband, recognizing primary role of caregiver in significant relationships with son, husband, mother, and father and pattern of not having emotional needs met because of difficulty communicating needs and expectations to significant others. Session focused on communication and behavior patterns in relationships.  Plan: Continue with cognitive and strength-based therapy. Pt. To think about developing healthy boundaries in relationships and communicating emotional needs to her husband and mother. Return again in 2 weeks  .  Diagnosis: Axis I: Depressive Disorder NOS  Axis II: No diagnosis  Renford Dills  03/15/2013

## 2013-03-16 ENCOUNTER — Other Ambulatory Visit (HOSPITAL_COMMUNITY): Payer: Self-pay | Admitting: Psychiatry

## 2013-03-16 DIAGNOSIS — F3162 Bipolar disorder, current episode mixed, moderate: Secondary | ICD-10-CM

## 2013-03-16 MED ORDER — ALPRAZOLAM 0.25 MG PO TABS: ORAL_TABLET | ORAL | Status: DC

## 2013-03-16 NOTE — Telephone Encounter (Signed)
Per Dr. Lolly MustacheArfeen, may refill Xanax today  Left message for pt: Informed pt that RX for Xanax authorized by Dr. Lolly MustacheArfeen and was called in.

## 2013-03-21 ENCOUNTER — Other Ambulatory Visit (HOSPITAL_COMMUNITY): Payer: Self-pay | Admitting: *Deleted

## 2013-03-21 DIAGNOSIS — F319 Bipolar disorder, unspecified: Secondary | ICD-10-CM

## 2013-03-21 MED ORDER — ESCITALOPRAM OXALATE 10 MG PO TABS: 10.0000 mg | ORAL_TABLET | Freq: Every day | ORAL | Status: DC

## 2013-03-21 NOTE — Telephone Encounter (Signed)
Fax Requests 90 day RX for Escitalopram per Inusrance requirement. Authorized by MD

## 2013-03-22 ENCOUNTER — Ambulatory Visit (HOSPITAL_COMMUNITY): Payer: Self-pay | Admitting: Psychiatry

## 2013-03-24 ENCOUNTER — Ambulatory Visit (HOSPITAL_COMMUNITY): Payer: Self-pay | Admitting: Psychiatry

## 2013-03-25 ENCOUNTER — Telehealth (HOSPITAL_COMMUNITY): Payer: Self-pay

## 2013-03-25 NOTE — Telephone Encounter (Signed)
I returned patient's phone call was complaining of increased insomnia.  We tried Vistaril but she does not feel it is working.  I recommend to reduced stimulant to 30 mg or take all in the morning .  Recommend to call us back if this does not help.

## 2013-03-28 ENCOUNTER — Ambulatory Visit (HOSPITAL_COMMUNITY): Payer: Self-pay | Admitting: Psychiatry

## 2013-03-31 ENCOUNTER — Ambulatory Visit (HOSPITAL_COMMUNITY): Payer: Self-pay | Admitting: Psychiatry

## 2013-04-01 IMAGING — CR DG UGI W/ KUB
2 series · 2 of 2 positions shown · non-contrast
Comparison: none

CLINICAL DATA: Morbid obesity. Previous gastric lap band which was
removed due to gastric erosion.  Pre-op evaluation for gastric
bypass surgery.

UPPER GI SERIES WITH KUB
TECHNIQUE: After obtaining a scout radiograph a single-column
upper GI series was performed using thin barium.
Fluoroscopy time: 2.0 minutes

[view not recorded (1 of 2)]
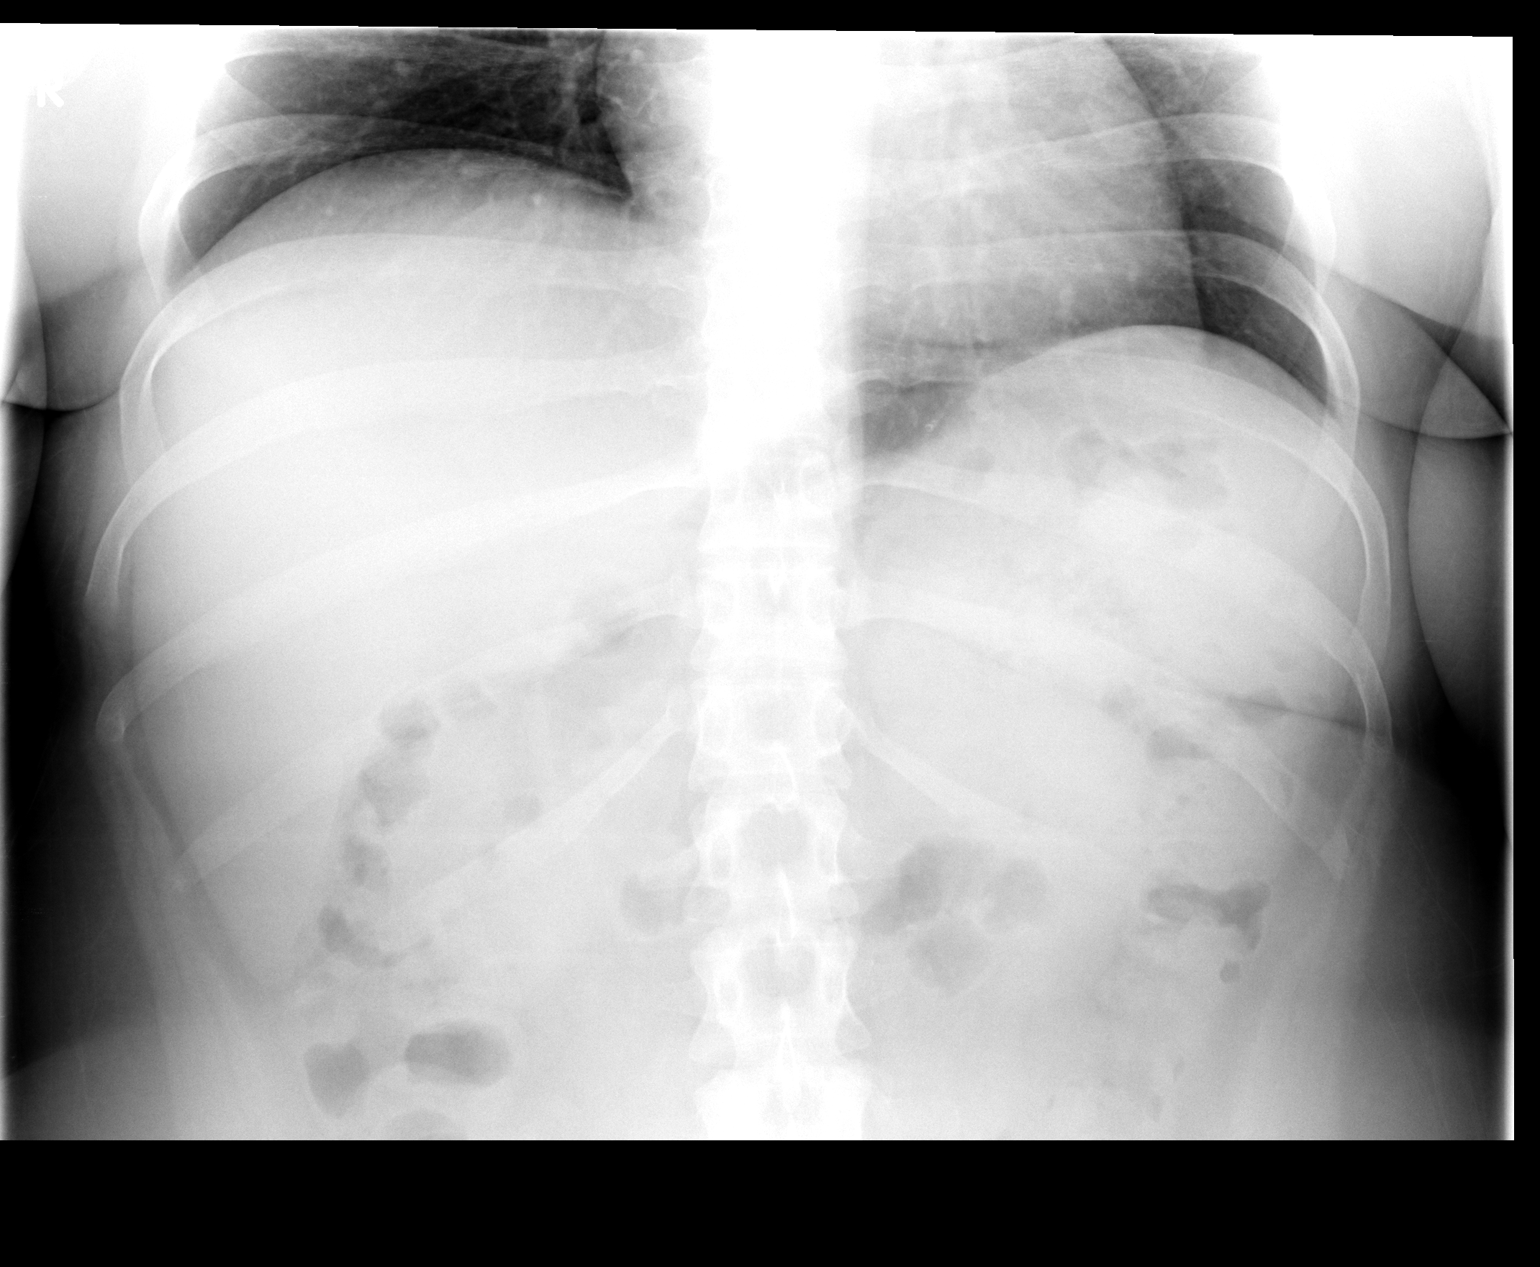

[view not recorded (2 of 2)]
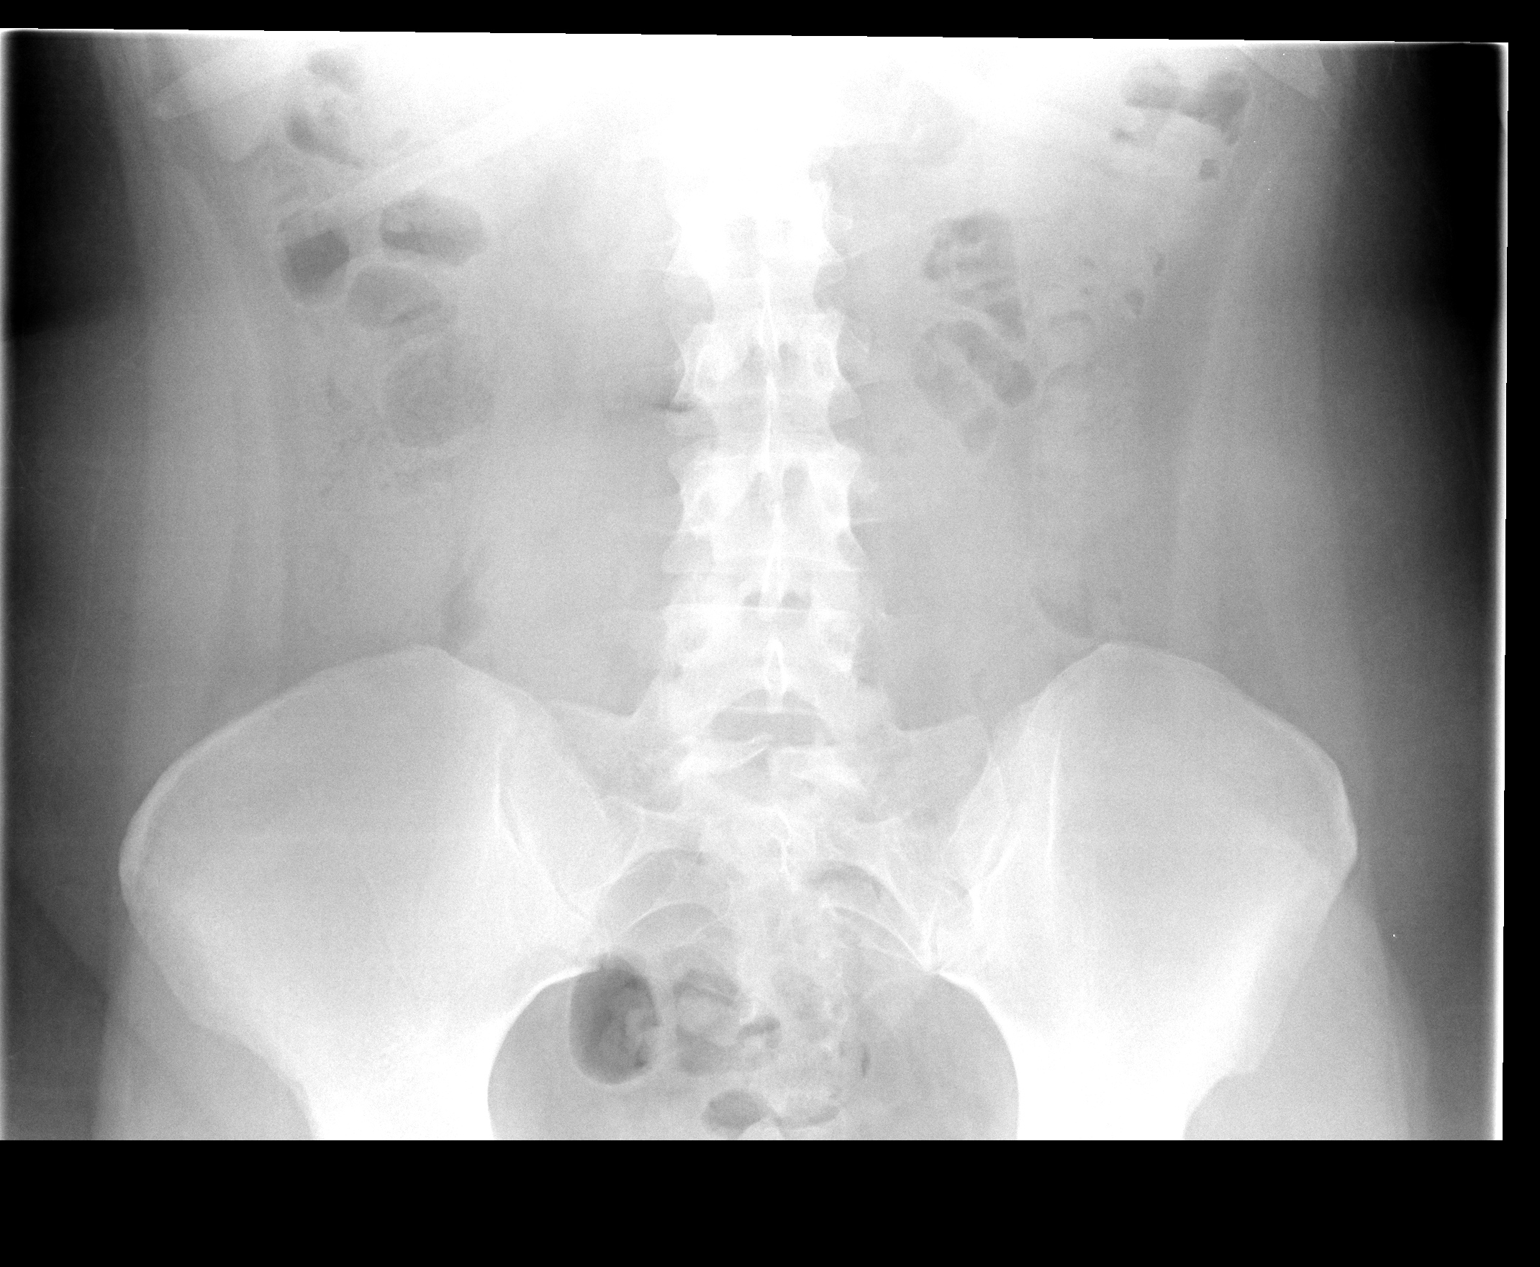

[2 of 2 positions shown; findings below may reference images not displayed]

FINDINGS: The scout radiograph shows a normal bowel gas pattern.

There is no evidence of esophageal mass or stricture.  There is no
evidence of hiatal hernia, and no gastroesophageal reflux was seen
during the exam.  Esophageal motility is within normal limits.

The stomach is normal in contour and appearance.  There is no
evidence of gastric masses or ulcers.  Duodenal bulb and sweep are
normal in appearance.
IMPRESSION: Negative UGI series.

## 2013-04-01 IMAGING — US US ABDOMEN COMPLETE
1 series · 14 of 25 positions shown · non-contrast
Comparison: 08/08/2009

CLINICAL DATA: Morbid obesity, bariatric surgery evaluation

ABDOMINAL ULTRASOUND COMPLETE

[Series 1: us abdomen complete · 14 of 79 slices shown]
[im 1/79]
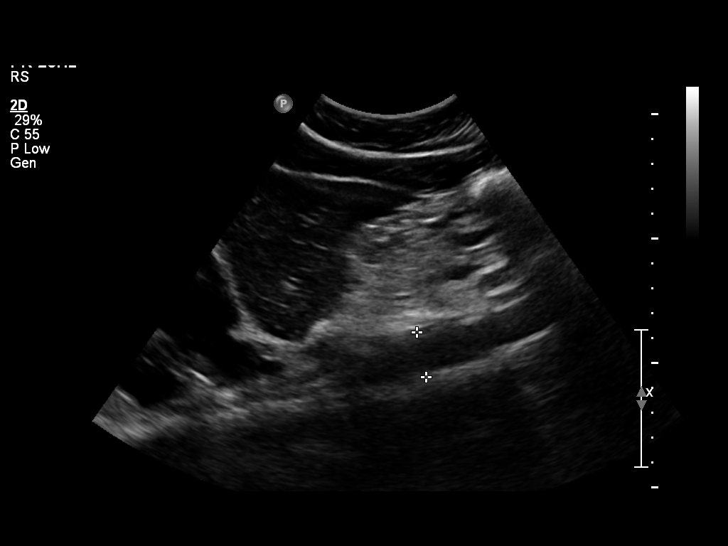
[im 7/79]
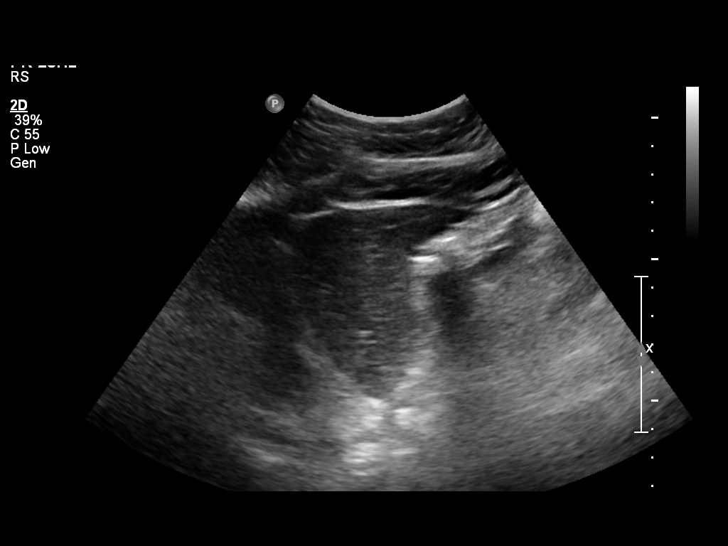
[im 14/79]
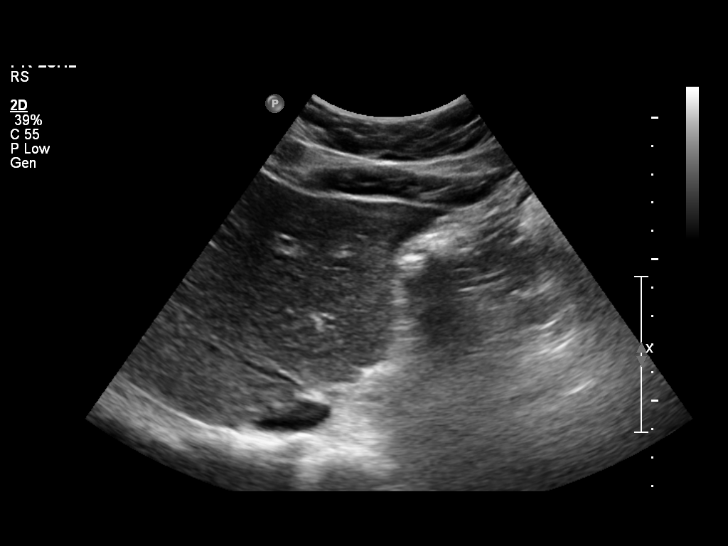
[im 20/79]
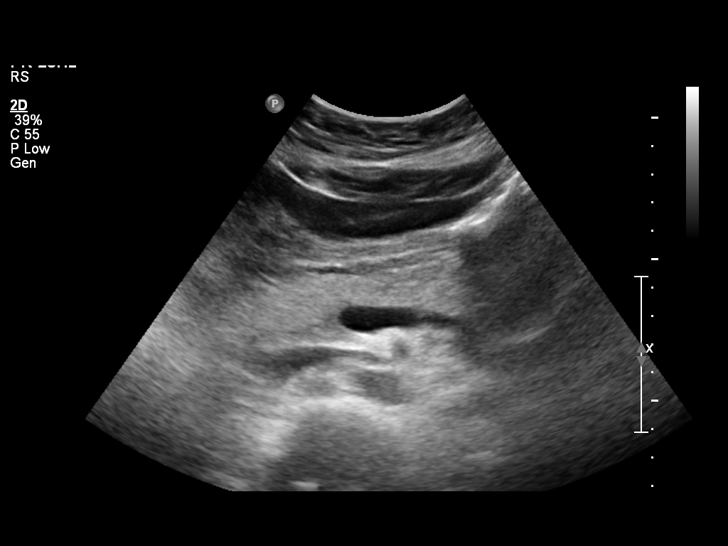
[im 27/79]
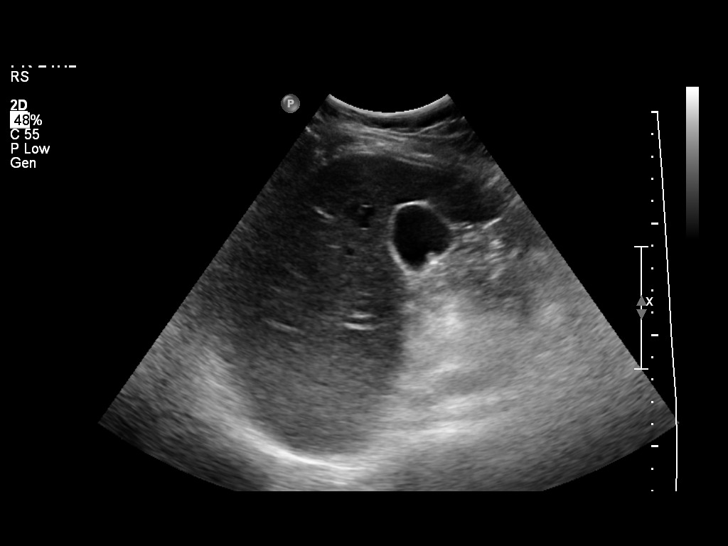
[im 30/79]
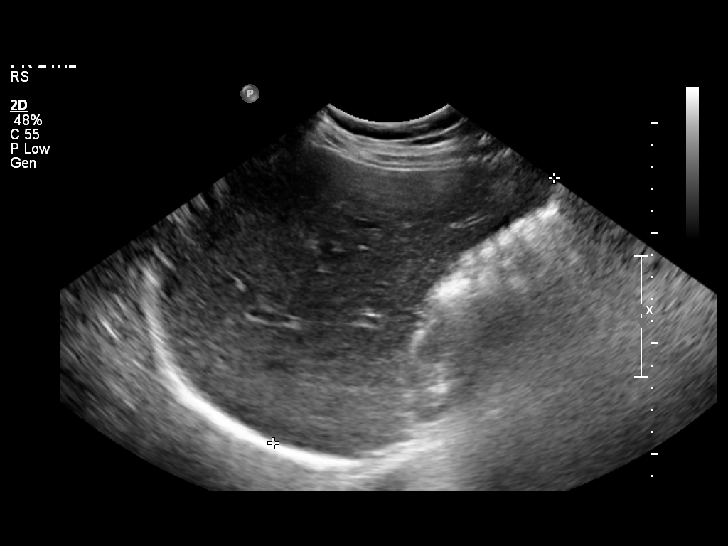
[im 36/79]
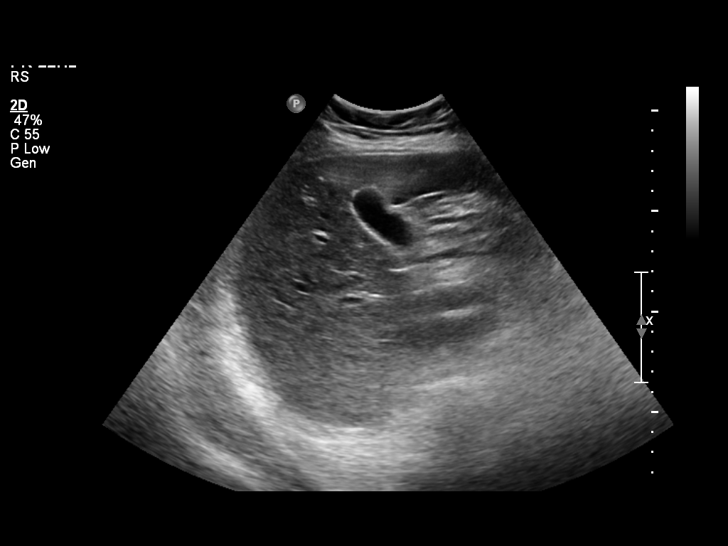
[im 43/79]
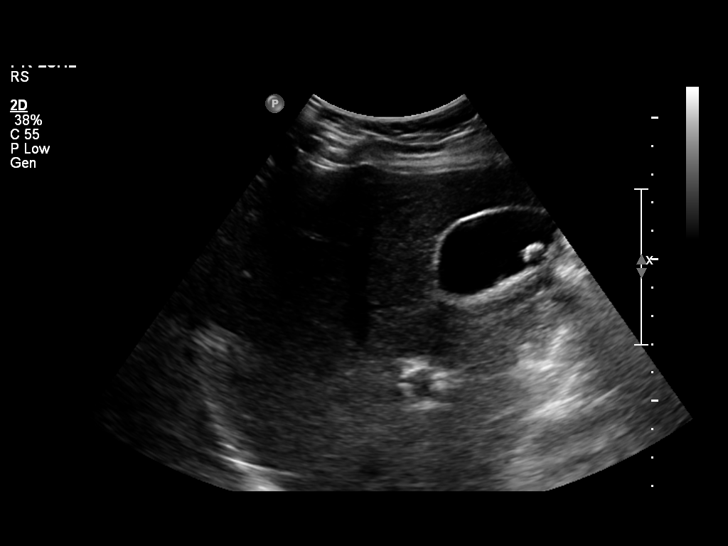
[im 49/79]
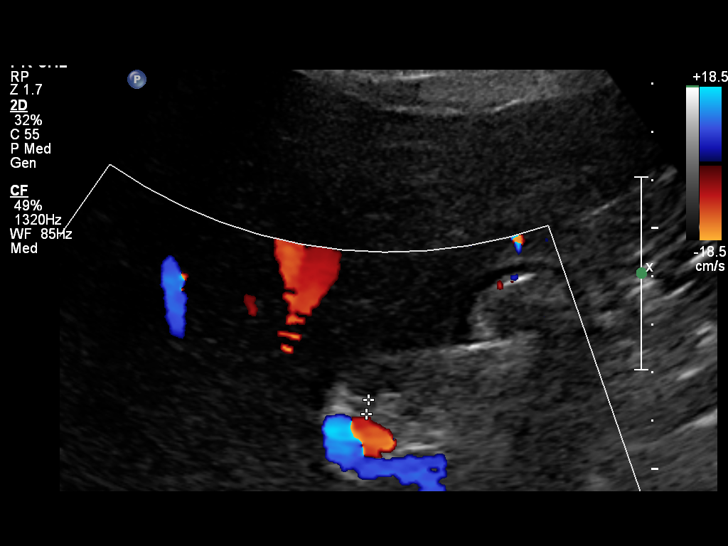
[im 53/79]
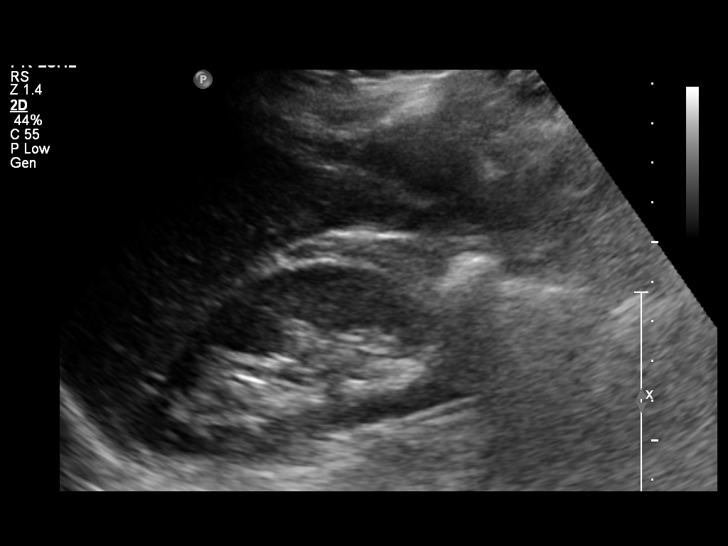
[im 59/79]
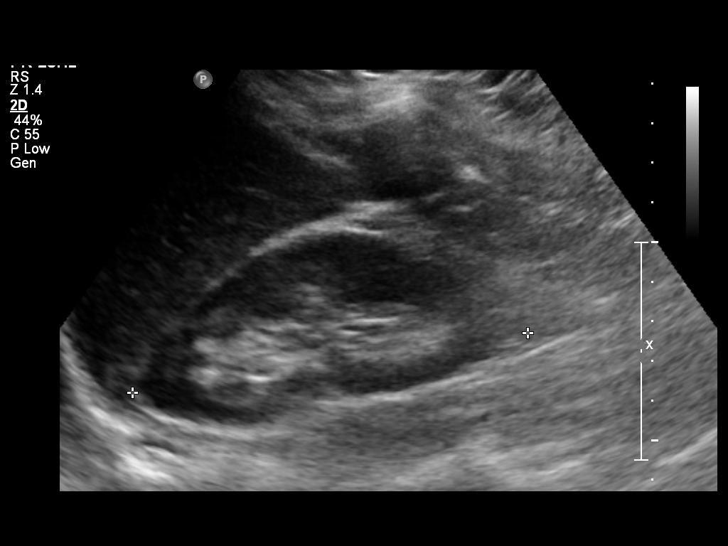
[im 66/79]
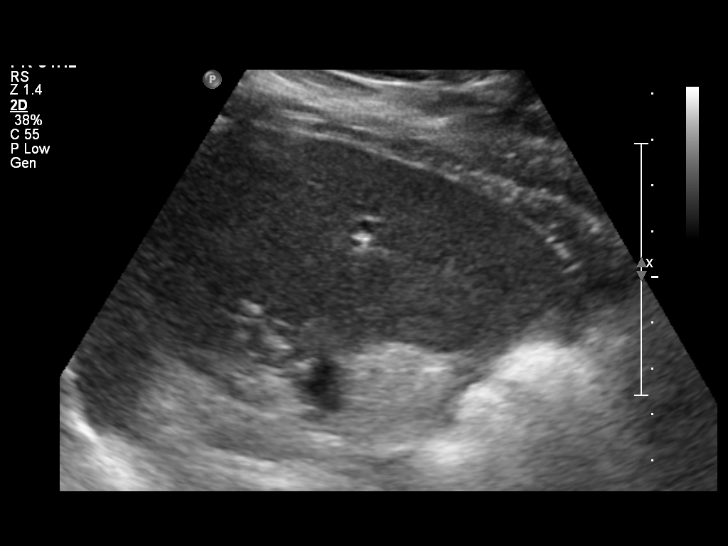
[im 72/79]
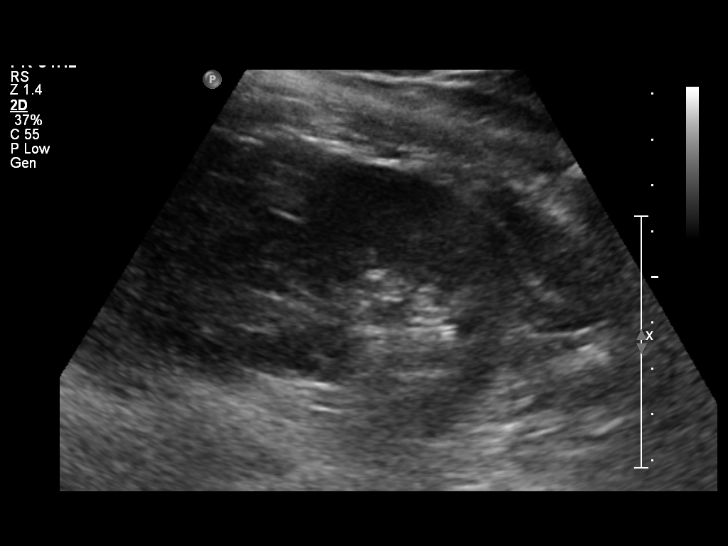
[im 79/79]
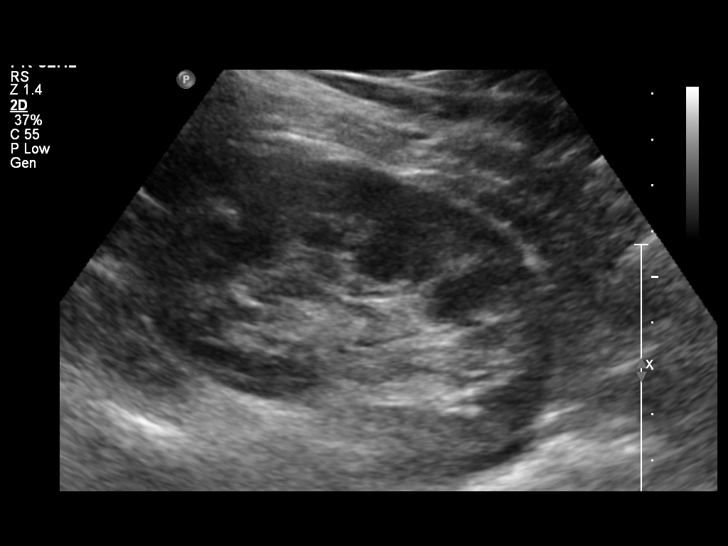

[14 of 25 positions shown; findings below may reference images not displayed]

FINDINGS: Gallbladder:  Small echogenic shadowing solitary gallstone
identified by ultrasound in the fundus.  This measures about 1 cm.
No associate Murphy's sign or wall thickening.  No pericholecystic
fluid.

Common Bile Duct:  Within normal limits in caliber.

Liver: No focal mass lesion identified.  Within normal limits in
parenchymal echogenicity.

IVC:  Appears normal.

Pancreas:  No abnormality identified.

Spleen:  Within normal limits in size and echotexture.

Right kidney:  Normal in size and parenchymal echogenicity.  No
evidence of mass or hydronephrosis.

Left kidney:  Normal in size and parenchymal echogenicity.  No
evidence of mass or hydronephrosis.

Abdominal Aorta:  No aneurysm identified.
IMPRESSION: Cholelithiasis otherwise normal study.

## 2013-04-04 ENCOUNTER — Other Ambulatory Visit (HOSPITAL_COMMUNITY): Payer: Self-pay | Admitting: Psychiatry

## 2013-04-04 DIAGNOSIS — F988 Other specified behavioral and emotional disorders with onset usually occurring in childhood and adolescence: Secondary | ICD-10-CM

## 2013-04-04 DIAGNOSIS — F319 Bipolar disorder, unspecified: Secondary | ICD-10-CM

## 2013-04-04 MED ORDER — LISDEXAMFETAMINE DIMESYLATE 20 MG PO CAPS: ORAL_CAPSULE | ORAL | Status: DC

## 2013-04-07 ENCOUNTER — Ambulatory Visit (HOSPITAL_COMMUNITY): Payer: Self-pay | Admitting: Psychiatry

## 2013-04-07 ENCOUNTER — Ambulatory Visit (INDEPENDENT_AMBULATORY_CARE_PROVIDER_SITE_OTHER): Payer: Managed Care, Other (non HMO) | Admitting: Psychiatry

## 2013-04-07 DIAGNOSIS — F3289 Other specified depressive episodes: Secondary | ICD-10-CM

## 2013-04-07 DIAGNOSIS — F3162 Bipolar disorder, current episode mixed, moderate: Secondary | ICD-10-CM

## 2013-04-07 DIAGNOSIS — F329 Major depressive disorder, single episode, unspecified: Secondary | ICD-10-CM

## 2013-04-07 NOTE — Progress Notes (Signed)
Patient ID: [name], female   DOB: 06/22/1980, 33 y.o.   MRN: [id number]  Session Time: 3:00-3:50  Participation Level: Active   Behavioral Response: CasualAlertDysphoric   Type of Therapy: Individual Therapy   Treatment Goals addressed: Emotion regulation, stress management, boundary development   Interventions: CBT   Summary: Heather DomeStephanie Dawn Franco is a 33 y.o. female who presents with anxiety and depression.   Suicidal/Homicidal: Nowithout intent/plan   Therapist Observations/Response: Pt. Presents as talkative, continues to complain of lethargy and sleeping alot. Pt. Reports concerns about husband's history of chronic pain and depression, and affect that his depression has on the marital relationship.  Plan: Continue with cognitive and strength-based therapy. Pt. To consider couples therapy and possible family session with her mother. Return again in 4 weeks  .  Diagnosis: Axis I: Depressive Disorder NOS  Axis II: No diagnosis  Wynonia MustyBrown, Jennifer B, COUNS  03/15/2013

## 2013-04-19 ENCOUNTER — Ambulatory Visit (HOSPITAL_COMMUNITY): Payer: Self-pay | Admitting: Psychiatry

## 2013-04-21 ENCOUNTER — Ambulatory Visit (HOSPITAL_COMMUNITY): Payer: Self-pay | Admitting: Psychiatry

## 2013-04-23 ENCOUNTER — Other Ambulatory Visit (HOSPITAL_COMMUNITY): Payer: Self-pay | Admitting: Psychiatry

## 2013-04-23 DIAGNOSIS — F319 Bipolar disorder, unspecified: Secondary | ICD-10-CM

## 2013-04-26 ENCOUNTER — Other Ambulatory Visit (HOSPITAL_COMMUNITY): Payer: Self-pay | Admitting: *Deleted

## 2013-04-26 ENCOUNTER — Ambulatory Visit (HOSPITAL_COMMUNITY): Payer: Self-pay | Admitting: Psychiatry

## 2013-04-26 DIAGNOSIS — F988 Other specified behavioral and emotional disorders with onset usually occurring in childhood and adolescence: Secondary | ICD-10-CM

## 2013-04-26 MED ORDER — LISDEXAMFETAMINE DIMESYLATE 20 MG PO CAPS: ORAL_CAPSULE | ORAL | Status: DC

## 2013-04-26 NOTE — Telephone Encounter (Signed)
Pt left ZO:[id number]:Will be here on Friday and wants to pick up new prescription for Vyvanse at that time so that she does not have to make a second trip

## 2013-04-28 ENCOUNTER — Ambulatory Visit (HOSPITAL_COMMUNITY): Payer: Self-pay | Admitting: Psychiatry

## 2013-04-29 ENCOUNTER — Ambulatory Visit (INDEPENDENT_AMBULATORY_CARE_PROVIDER_SITE_OTHER): Payer: Managed Care, Other (non HMO) | Admitting: Psychiatry

## 2013-04-29 DIAGNOSIS — F3162 Bipolar disorder, current episode mixed, moderate: Secondary | ICD-10-CM

## 2013-04-29 NOTE — Progress Notes (Signed)
   THERAPIST PROGRESS NOTE Session Time: 2:00-2:50   Participation Level: Active   Behavioral Response: CasualAlertEuthymic   Type of Therapy: FamilyTherapy   Treatment Goals addressed: Emotion regulation, stress management, boundary development   Interventions: CBT   Summary: [name] is a 33 y.o. female who presents with anxiety and depression.   Suicidal/Homicidal: Nowithout intent/plan   Therapist Observations/Response: Pt. Joined in session with son [name], Heather years) to address how her depression might have affected her son and the toll that her husband's disability might have had on the child's emotional development. Pt. Expressed her concerns and provided opportunity for son to express his feeling.  Plan: Continue with cognitive and strength-based therapy. Pt. To consider future sessions with her son and husband. Return again in 4 weeks  .  Diagnosis: Axis I: Depressive Disorder NOS   Axis II: No diagnosis   Wynonia MustyBrown, Jennifer B, COUNS 04/29/2013

## 2013-05-06 ENCOUNTER — Ambulatory Visit (INDEPENDENT_AMBULATORY_CARE_PROVIDER_SITE_OTHER): Payer: Managed Care, Other (non HMO) | Admitting: Psychiatry

## 2013-05-06 DIAGNOSIS — F3162 Bipolar disorder, current episode mixed, moderate: Secondary | ICD-10-CM

## 2013-05-06 NOTE — Progress Notes (Signed)
   THERAPIST PROGRESS NOTE  Session Time: 3:00-3:50   Participation Level: Active   Behavioral Response: CasualAlertEuthymic   Type of Therapy: FamilyTherapy   Treatment Goals addressed: Emotion regulation, stress management, boundary development   Interventions: CBT   Summary: [name] is a 33 y.o. female who presents with anxiety and depression.   Suicidal/Homicidal: Nowithout intent/plan   Therapist Observations/Response: Pt. Presents as talkative, laughs appropriately. Pt. Reports concerns about Dr. Lolly MustacheArfeen not continuing her prescription for vyvanse. Pt. Feels that vyvanse helps with mood and concentration.Pt. Continues to split seroquel to help with sleep. Session focused on requesting family support as needed and job exploration.   Plan: Continue with cognitive and strength-based therapy. Pt. To consider future sessions with her son and husband. Return again in 1 week.  .  Diagnosis: Axis I: Depressive Disorder NOS  Axis II: No diagnosis    Wynonia MustyBrown, Kasen Sako B, COUNS 05/06/2013

## 2013-05-09 ENCOUNTER — Ambulatory Visit (INDEPENDENT_AMBULATORY_CARE_PROVIDER_SITE_OTHER): Payer: Managed Care, Other (non HMO) | Admitting: Psychiatry

## 2013-05-09 ENCOUNTER — Encounter (HOSPITAL_COMMUNITY): Payer: Self-pay | Admitting: Psychiatry

## 2013-05-09 VITALS — BP 155/85 | HR 122 | Ht 61.73 in | Wt 137.8 lb

## 2013-05-09 DIAGNOSIS — F3130 Bipolar disorder, current episode depressed, mild or moderate severity, unspecified: Secondary | ICD-10-CM

## 2013-05-09 DIAGNOSIS — F988 Other specified behavioral and emotional disorders with onset usually occurring in childhood and adolescence: Secondary | ICD-10-CM

## 2013-05-09 MED ORDER — TRAZODONE HCL 50 MG PO TABS: ORAL_TABLET | ORAL | Status: DC

## 2013-05-09 MED ORDER — LISDEXAMFETAMINE DIMESYLATE 20 MG PO CAPS: ORAL_CAPSULE | ORAL | Status: DC
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
## 2013-05-12 ENCOUNTER — Ambulatory Visit (HOSPITAL_COMMUNITY): Payer: Self-pay | Admitting: Psychiatry

## 2013-05-23 ENCOUNTER — Ambulatory Visit (HOSPITAL_COMMUNITY): Payer: Managed Care, Other (non HMO) | Admitting: Psychiatry

## 2013-05-23 DIAGNOSIS — F3162 Bipolar disorder, current episode mixed, moderate: Secondary | ICD-10-CM
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
## 2013-05-26 ENCOUNTER — Ambulatory Visit (HOSPITAL_COMMUNITY): Payer: Self-pay | Admitting: Psychiatry

## 2013-06-02 ENCOUNTER — Ambulatory Visit (HOSPITAL_COMMUNITY): Payer: Managed Care, Other (non HMO) | Admitting: Psychiatry

## 2013-06-02 DIAGNOSIS — F3162 Bipolar disorder, current episode mixed, moderate: Secondary | ICD-10-CM

## 2013-06-06 NOTE — Progress Notes (Signed)
   THERAPIST PROGRESS NOTE  Session Time: 3:00-3:50  Participation Level: Active  Behavioral Response: CasualAlertEuthymic  Type of Therapy: Group Therapy  Treatment Goals addressed: emotion regulation, stress management, communication skills  Interventions: CBT  Summary: [name] is a 33 y.o. female who presents with depression.   Suicidal/Homicidal: Nowithout intent/plan  Therapist Response:  Pt. Presents with euthymic mood, at times appropriately tearful. Pt. Laughs and smiles appropriately. Majority of session spent processing conflicted relationship with mother and brother. Pt. Developing awareness of feelings of unworthiness and feeling as though she was never as good as her brother in the eyes of her mother. Pt. Recognizing pattern of caregiving in the mother-daughter relationship with the goal of earning her mother's approval. Pt. Recognizing where she can begin to exert healthy boundaries in mother-daughter relationship.  Plan: Pt. To continue with CBT based treatment. Return again in 2 weeks.  Diagnosis: Axis I: Depressive Disorder NOS    Axis II: No diagnosis    Wynonia MustyBrown, Kelvyn Schunk B, COUNS 06/06/2013

## 2013-06-09 ENCOUNTER — Ambulatory Visit (HOSPITAL_COMMUNITY): Payer: Self-pay | Admitting: Psychiatry

## 2013-06-14 ENCOUNTER — Other Ambulatory Visit (HOSPITAL_COMMUNITY): Payer: Self-pay | Admitting: Psychiatry

## 2013-06-14 ENCOUNTER — Ambulatory Visit (INDEPENDENT_AMBULATORY_CARE_PROVIDER_SITE_OTHER): Payer: Managed Care, Other (non HMO) | Admitting: Psychiatry

## 2013-06-14 ENCOUNTER — Encounter (HOSPITAL_COMMUNITY): Payer: Self-pay | Admitting: Psychiatry

## 2013-06-14 VITALS — BP 140/80 | HR 127 | Ht 61.0 in | Wt 127.4 lb

## 2013-06-14 DIAGNOSIS — F319 Bipolar disorder, unspecified: Secondary | ICD-10-CM

## 2013-06-14 DIAGNOSIS — F3162 Bipolar disorder, current episode mixed, moderate: Secondary | ICD-10-CM

## 2013-06-14 DIAGNOSIS — F988 Other specified behavioral and emotional disorders with onset usually occurring in childhood and adolescence: Secondary | ICD-10-CM

## 2013-06-14 DIAGNOSIS — F3130 Bipolar disorder, current episode depressed, mild or moderate severity, unspecified: Secondary | ICD-10-CM

## 2013-06-14 MED ORDER — LISDEXAMFETAMINE DIMESYLATE 20 MG PO CAPS: ORAL_CAPSULE | ORAL | Status: DC

## 2013-06-14 MED ORDER — OLANZAPINE 5 MG PO TABS: ORAL_TABLET | ORAL | Status: DC

## 2013-06-14 MED ORDER — ESCITALOPRAM OXALATE 10 MG PO TABS: 10.0000 mg | ORAL_TABLET | Freq: Every day | ORAL | Status: DC
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
## 2013-06-16 ENCOUNTER — Other Ambulatory Visit (HOSPITAL_COMMUNITY): Payer: Self-pay | Admitting: Psychiatry

## 2013-06-16 ENCOUNTER — Ambulatory Visit (INDEPENDENT_AMBULATORY_CARE_PROVIDER_SITE_OTHER): Payer: Managed Care, Other (non HMO) | Admitting: Psychiatry

## 2013-06-16 DIAGNOSIS — F319 Bipolar disorder, unspecified: Secondary | ICD-10-CM

## 2013-06-16 NOTE — Telephone Encounter (Signed)
Given script few days ago.

## 2013-06-17 NOTE — Progress Notes (Signed)
   THERAPIST PROGRESS NOTE  Session Time: 3:00-4:00   Participation Level: Active   Behavioral Response: CasualAlertEuthymic   Type of Therapy: Group Therapy   Treatment Goals addressed: emotion regulation, stress management, communication skills   Interventions: CBT   Summary: [name] is a 33 y.o. female who presents with depression.   Suicidal/Homicidal: Nowithout intent/plan   Therapist Response: Pt. Presents with euthymic mood, talks and laughs appropriately. Pt. Reports medication change because seroquel was too sedating. Pt. Reports that she is hopeful about new medication. Pt. Reports significant progress in job transition process, has begun the process of applying for new employment and has received several phone calls and one interview. Pt. Was able to actively engage in cost/benefit analysis regarding job and because it did not fit her current needs was able to positively cope with not getting the job. Pt. Also recognizes change in attitude toward current job, positive interactions with co-workers, and Careers advisereffective communication with managers. Pt. Reports that she has been able to be more assertive with her mother and brother and is content with decision to turn over financial power of attorney to her brother. Pt. Also reports that communication is improving with her husband. Session focused on positive reinforcement of development of effective boundaries in family and work relationships.   Plan: Pt. To continue with CBT based treatment. Return again in 2 weeks.   Diagnosis: Axis I: Depressive Disorder NOS   Axis II: No diagnosis     Wynonia MustyBrown, Oakleigh Hesketh B, COUNS 06/17/2013

## 2013-06-23 ENCOUNTER — Ambulatory Visit (INDEPENDENT_AMBULATORY_CARE_PROVIDER_SITE_OTHER): Payer: Managed Care, Other (non HMO) | Admitting: Psychiatry

## 2013-06-23 DIAGNOSIS — F319 Bipolar disorder, unspecified: Secondary | ICD-10-CM

## 2013-06-23 DIAGNOSIS — F988 Other specified behavioral and emotional disorders with onset usually occurring in childhood and adolescence: Secondary | ICD-10-CM

## 2013-06-24 ENCOUNTER — Other Ambulatory Visit (HOSPITAL_COMMUNITY): Payer: Self-pay | Admitting: Psychiatry

## 2013-06-24 DIAGNOSIS — F3162 Bipolar disorder, current episode mixed, moderate: Secondary | ICD-10-CM

## 2013-06-24 NOTE — Progress Notes (Signed)
   THERAPIST PROGRESS NOTE  Session Time: 3:00-4:00   Participation Level: Active   Behavioral Response: CasualAlertEuthymic   Type of Therapy: Group Therapy   Treatment Goals addressed: emotion regulation, stress management, communication skills   Interventions: CBT   Summary: [name] is a 33 y.o. female who presents with depression.   Suicidal/Homicidal: Nowithout intent/plan   Therapist Response: Pt. Continues to present with euthymic mood, talks and laughs appropriately. Pt. Reports that she is managing job stress and continues to make significant progress in job transition. Pt. Reports that she did not get the first job that she applied for but that she was not devastated because she was able to evaluate the costs and benefits of the job and determined that it would not be a good fit for her family. Pt. Had a second interview prior to today's session and reported that the interview went well and that she was able to state her personal strengths with confidence. Pt. Reports significant progress in relationship with there mother and brother. Pt. Reports that she set firm boundary with her mother by communicating that she will no longer function as financial power of attorney and that her mother will have to actively look for a job. Pt. Reports that communication and intimacy with her husband have improved. Session focused on reinforcement of progress during sessions and preparation for discharge.   Plan: Pt. To continue with CBT based treatment. Return again in 2 weeks.   Diagnosis: Axis I: Depressive Disorder NOS   Axis II: No diagnosis     Wynonia MustyBrown, Jennifer B, COUNS 06/24/2013

## 2013-06-29 ENCOUNTER — Other Ambulatory Visit (HOSPITAL_COMMUNITY): Payer: Self-pay | Admitting: Psychiatry

## 2013-06-29 DIAGNOSIS — F3162 Bipolar disorder, current episode mixed, moderate: Secondary | ICD-10-CM

## 2013-06-30 ENCOUNTER — Ambulatory Visit (INDEPENDENT_AMBULATORY_CARE_PROVIDER_SITE_OTHER): Payer: Managed Care, Other (non HMO) | Admitting: Psychiatry

## 2013-06-30 ENCOUNTER — Other Ambulatory Visit (HOSPITAL_COMMUNITY): Payer: Self-pay | Admitting: *Deleted

## 2013-06-30 DIAGNOSIS — F319 Bipolar disorder, unspecified: Secondary | ICD-10-CM

## 2013-06-30 DIAGNOSIS — F3162 Bipolar disorder, current episode mixed, moderate: Secondary | ICD-10-CM

## 2013-06-30 MED ORDER — LAMOTRIGINE 150 MG PO TABS: ORAL_TABLET | ORAL | Status: DC

## 2013-06-30 NOTE — Progress Notes (Signed)
   THERAPIST PROGRESS NOTE  Session Time: 3:00-4:00   Participation Level: Active   Behavioral Response: CasualAlertEuthymic   Type of Therapy: Group Therapy   Treatment Goals addressed: emotion regulation, stress management, communication skills   Interventions: CBT   Summary: Heather DomeStephanie Dawn Franco is a 33 y.o. female who presents with depression.   Suicidal/Homicidal: Nowithout intent/plan   Therapist Response: Pt. Continues to make progress as evidenced by euthymic mood, talks and laughs appropriately. Pt. Continues to set healthy boundaries in her relationships with her mother and with her father. Pt. Has had two job interviews and although did not result in a job Pt. Reports that she is happy with the progress that she has made in looking for a job and continues to apply for jobs. Pt. Reviewed her work history and was able to recognize that the longevity in her current position is a strength. Pt. Reports that communication with her husband continues to improve. Pt. Reports confidence in her parenting ability and commitment to continue to take care of the needs of her family.   Plan: Pt. To continue with CBT based treatment. Return again in 2 weeks.   Diagnosis: Axis I: Depressive Disorder NOS   Axis II: No diagnosis     Wynonia MustyBrown, Jas Betten B, COUNS 06/30/2013

## 2013-07-04 ENCOUNTER — Telehealth (HOSPITAL_COMMUNITY): Payer: Self-pay

## 2013-07-07 ENCOUNTER — Ambulatory Visit (INDEPENDENT_AMBULATORY_CARE_PROVIDER_SITE_OTHER): Payer: Managed Care, Other (non HMO) | Admitting: Psychiatry

## 2013-07-07 ENCOUNTER — Other Ambulatory Visit (HOSPITAL_COMMUNITY): Payer: Self-pay | Admitting: Psychiatry

## 2013-07-07 DIAGNOSIS — F319 Bipolar disorder, unspecified: Secondary | ICD-10-CM

## 2013-07-08 NOTE — Progress Notes (Signed)
   THERAPIST PROGRESS NOTE  Session Time: 3:00-4:00   Participation Level: Active   Behavioral Response: CasualAlertEuthymic   Type of Therapy: Individual Therapy   Treatment Goals addressed: emotion regulation, stress management, communication skills   Interventions: CBT   Summary: [name] is a 33 y.o. female who presents with depression.   Suicidal/Homicidal: Nowithout intent/plan   Therapist Response: Pt. Continues to demonstrate progress as evidenced by euthymic mood, talks and laughs appropriately. Pt. Processed recent communications with her mother and consequences of setting boundaries with her mother. Pt. Also discussed recent communications with her father and consequences of boundary setting. Pt. Reports more confidence and ability to not engage in excessive care-giving, but processing guilt related to developing new relationships with her parents.  Plan: Pt. To continue with CBT based treatment. Return again in 1 week.   Diagnosis: Axis I: Depressive Disorder NOS   Axis II: No diagnosis      Wynonia MustyBrown, Ikea Demicco B, COUNS 07/08/2013

## 2013-07-09 ENCOUNTER — Other Ambulatory Visit (HOSPITAL_COMMUNITY): Payer: Self-pay | Admitting: Psychiatry

## 2013-07-09 NOTE — Telephone Encounter (Signed)
It is discontinued

## 2013-07-12 ENCOUNTER — Telehealth (HOSPITAL_COMMUNITY): Payer: Self-pay

## 2013-07-14 ENCOUNTER — Ambulatory Visit (INDEPENDENT_AMBULATORY_CARE_PROVIDER_SITE_OTHER): Payer: Managed Care, Other (non HMO) | Admitting: Psychiatry

## 2013-07-14 DIAGNOSIS — F319 Bipolar disorder, unspecified: Secondary | ICD-10-CM

## 2013-07-15 ENCOUNTER — Ambulatory Visit (INDEPENDENT_AMBULATORY_CARE_PROVIDER_SITE_OTHER): Payer: Managed Care, Other (non HMO) | Admitting: Psychiatry

## 2013-07-15 ENCOUNTER — Encounter (HOSPITAL_COMMUNITY): Payer: Self-pay | Admitting: Psychiatry

## 2013-07-15 VITALS — BP 135/80 | HR 80 | Wt 137.0 lb

## 2013-07-15 DIAGNOSIS — F319 Bipolar disorder, unspecified: Secondary | ICD-10-CM

## 2013-07-15 DIAGNOSIS — F988 Other specified behavioral and emotional disorders with onset usually occurring in childhood and adolescence: Secondary | ICD-10-CM

## 2013-07-15 DIAGNOSIS — F3130 Bipolar disorder, current episode depressed, mild or moderate severity, unspecified: Secondary | ICD-10-CM

## 2013-07-15 MED ORDER — QUETIAPINE FUMARATE 50 MG PO TABS
50.0000 mg | ORAL_TABLET | Freq: Every day | ORAL | Status: DC
Start: 2013-07-15 — End: 2013-09-19

## 2013-07-15 MED ORDER — LISDEXAMFETAMINE DIMESYLATE 20 MG PO CAPS: ORAL_CAPSULE | ORAL | Status: DC

## 2013-07-15 MED ORDER — ESCITALOPRAM OXALATE 20 MG PO TABS: 20.0000 mg | ORAL_TABLET | Freq: Every day | ORAL | Status: DC
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
## 2013-07-15 NOTE — Progress Notes (Signed)
   THERAPIST PROGRESS NOTE  Session Time: 3:00-4:00   Participation Level: Active   Behavioral Response: CasualAlertEuthymic   Type of Therapy: Individual Therapy   Treatment Goals addressed: emotion regulation, stress management, communication skills   Interventions: CBT   Summary: [name] is a 33 y.o. female who presents with depression.   Suicidal/Homicidal: Nowithout intent/plan   Therapist Response: Pt. Presents with bright affect. Pt. Reports that she has acne pimples over face, head, back and shoulders that she attributes to medication change. Encouraged patient to discuss the possible reaction with Dr. Lolly Mustache. Pt. Continues to demonstrate progress as evidenced by euthymic mood, talks and laughs appropriately. Pt. Continuing to process anger towards her mother and mother's reactions to her setting relationship boundaries. Pt. Continues to report gaining confidence in her ability not to engage in excessive caregiving in relationships with her mother and father. Significant time during session processing guilt related to developing new relationships with her parents and fears not meeting their expectations.   Plan: Pt. To continue with CBT based treatment. Return again in 4 week.   Diagnosis: Axis I: Depressive Disorder NOS   Axis II: No diagnosis     [name] 07/15/2013

## 2013-07-26 ENCOUNTER — Ambulatory Visit (INDEPENDENT_AMBULATORY_CARE_PROVIDER_SITE_OTHER): Payer: Managed Care, Other (non HMO) | Admitting: Psychiatry

## 2013-07-26 DIAGNOSIS — F319 Bipolar disorder, unspecified: Secondary | ICD-10-CM

## 2013-07-26 DIAGNOSIS — F988 Other specified behavioral and emotional disorders with onset usually occurring in childhood and adolescence: Secondary | ICD-10-CM

## 2013-07-27 ENCOUNTER — Telehealth (HOSPITAL_COMMUNITY): Payer: Self-pay

## 2013-07-27 NOTE — Progress Notes (Signed)
   THERAPIST PROGRESS NOTE  Session Time: 3:00-4:00   Participation Level: Active   Behavioral Response: CasualAlertEuthymic   Type of Therapy: Individual Therapy   Treatment Goals addressed: emotion regulation, stress management, communication skills   Interventions: CBT   Summary: [name] is a 33 y.o. female who presents with depression.   Suicidal/Homicidal: Nowithout intent/plan   Therapist Response: Pt. Continues to present with bright affect. Pt. Reports continued progress in setting healthy boundaries in relationships with her mother and father. Pt. Reports that she is developing practice of focusing on acceptance of her mother which has allowed her to counter thoughts of disappointment with her mother's behavior because of expectations for the "mother I should have had and wanted". Instead Pt. Is doing better with accepting her mother for who she is. This has contributed that lowering Pt.'s stress load and feeling that she has to excessively caregive for her mother in order to strive for her mother's love and acceptance. Pt. Reports that relationship with her father is also improving as indicated by less anxiety about her father's health. Pt. Continues to work on job transition, but communicates confidence regarding her current job duties and effective work relationships. Pt. Reports that skin is clearing up and feels better now that she has returned to taking seroquel.  Plan: Pt. To continue with CBT based treatment. Return again in 4 week.   Diagnosis: Axis I: Depressive Disorder NOS   Axis II: No diagnosis      [name] 07/27/2013

## 2013-07-28 ENCOUNTER — Telehealth (HOSPITAL_COMMUNITY): Payer: Self-pay | Admitting: Psychiatry

## 2013-07-28 NOTE — Telephone Encounter (Signed)
I returned patient's phone call.  She is complaining of increased anxiety, depression and feeling overwhelmed.  She admitted skipping days from the work because she cannot concentrate.  She is requesting a letter for her out of work.  I also discussed intensive outpatient program and patient is willing to try intensive outpatient program.  I will contact program coordinator Jeri Modena.  We will take her out from the work because patient is going to start intensive outpatient program.

## 2013-07-28 NOTE — Telephone Encounter (Signed)
I returned patient's phone call I left a message. 

## 2013-07-29 ENCOUNTER — Telehealth (HOSPITAL_COMMUNITY): Payer: Self-pay

## 2013-08-02 ENCOUNTER — Telehealth (HOSPITAL_COMMUNITY): Payer: Self-pay

## 2013-08-03 ENCOUNTER — Other Ambulatory Visit (HOSPITAL_COMMUNITY): Payer: Managed Care, Other (non HMO) | Attending: Psychiatry | Admitting: Psychiatry

## 2013-08-03 ENCOUNTER — Encounter (HOSPITAL_COMMUNITY): Payer: Self-pay

## 2013-08-03 DIAGNOSIS — F909 Attention-deficit hyperactivity disorder, unspecified type: Secondary | ICD-10-CM | POA: Insufficient documentation

## 2013-08-03 DIAGNOSIS — F411 Generalized anxiety disorder: Secondary | ICD-10-CM | POA: Insufficient documentation

## 2013-08-03 DIAGNOSIS — F332 Major depressive disorder, recurrent severe without psychotic features: Secondary | ICD-10-CM | POA: Insufficient documentation
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
## 2013-08-03 NOTE — Progress Notes (Signed)
    Daily Group Progress Note  Program: IOP  Group Time: 9:00-10:30  Participation Level: Active  Behavioral Response: Appropriate  Type of Therapy:  Group Therapy  Summary of Progress: Pt. Met with psychiatrist and case manager.     Group Time: 10:30-12:00  Participation Level:  Active  Behavioral Response: Appropriate  Type of Therapy: Psycho-education Group  Summary of Progress: Pt. Met with psychiatrist and case manager.  Harlan Ervine B, COUNS 

## 2013-08-04 ENCOUNTER — Other Ambulatory Visit (HOSPITAL_COMMUNITY): Payer: Managed Care, Other (non HMO) | Admitting: Psychiatry

## 2013-08-04 DIAGNOSIS — F988 Other specified behavioral and emotional disorders with onset usually occurring in childhood and adolescence: Secondary | ICD-10-CM

## 2013-08-04 DIAGNOSIS — F319 Bipolar disorder, unspecified: Secondary | ICD-10-CM

## 2013-08-05 ENCOUNTER — Encounter (HOSPITAL_COMMUNITY): Payer: Self-pay | Admitting: Psychiatry

## 2013-08-05 ENCOUNTER — Other Ambulatory Visit (HOSPITAL_COMMUNITY): Payer: Managed Care, Other (non HMO) | Admitting: Psychiatry

## 2013-08-05 DIAGNOSIS — F988 Other specified behavioral and emotional disorders with onset usually occurring in childhood and adolescence: Secondary | ICD-10-CM

## 2013-08-05 NOTE — Progress Notes (Signed)
    Daily Group Progress Note  Program: IOP  Group Time: 9:00-10:30  Participation Level: Active  Behavioral Response: Appropriate  Type of Therapy:  Group Therapy  Summary of Progress: Pt. Reported that she was doing "good". Pt. Required redirection of her cell phone use during group; Pt. Received feedback regarding her cell phone positively.     Group Time: 10:30-12:00  Participation Level:  Active  Behavioral Response: Appropriate  Type of Therapy: Psycho-education Group  Summary of Progress: Pt. Was alert and attentive and participated during yoga grounding sequence.  Shaune PollackBrown, Jennifer B, COUNS

## 2013-08-05 NOTE — Progress Notes (Signed)
    Daily Group Progress Note  Program: IOP  Group Time: 9:00-10:30  Participation Level: Active  Behavioral Response: Monopolizing  Type of Therapy:  Group Therapy  Summary of Progress: Pt. Participated in meditation practice. Pt. Reported that her mood was good, but continuing to work on sleep issues. Pt. Feels that she should be able to get up by 7:00 and feel refreshed but she frequently feels sluggish and tired.     Group Time: 10:30-12:00  Participation Level:  None  Behavioral Response: n/a  Type of Therapy: Psycho-education Group  Summary of Progress: Pt. Did not attend due to program at son's school.  Shaune PollackBrown, Cordarro Spinnato B, COUNS

## 2013-08-08 ENCOUNTER — Encounter (HOSPITAL_COMMUNITY): Payer: Self-pay | Admitting: Psychiatry

## 2013-08-08 ENCOUNTER — Other Ambulatory Visit (HOSPITAL_COMMUNITY): Payer: Managed Care, Other (non HMO) | Admitting: Psychiatry

## 2013-08-08 DIAGNOSIS — F319 Bipolar disorder, unspecified: Secondary | ICD-10-CM

## 2013-08-08 DIAGNOSIS — F988 Other specified behavioral and emotional disorders with onset usually occurring in childhood and adolescence: Secondary | ICD-10-CM

## 2013-08-08 NOTE — Progress Notes (Signed)
    Daily Group Progress Note  Program: IOP  Group Time: 9:00-10:30  Participation Level: Active  Behavioral Response: Appropriate  Type of Therapy:  Group Therapy  Summary of Progress: Pt. Participated in meditation exercise. Pt. Reported that she was doing "good". Pt. Reported that she was active and had a good weekend. Pt. Indicates that she is not depressed but needs to work on managing symptoms related to ADHD. Pt. Has been responsive to redirection and gives helpful feedback to other patients.     Group Time: 10:30-12:00  Participation Level:  Active  Behavioral Response: Appropriate  Type of Therapy: Psycho-education Group  Summary of Progress: Pt. Participated in discussion about worksheet "just for today..the choice is mine."  Home DepotBrown, SCA[employer] CorporationJennifer B, [address]NS

## 2013-08-09 ENCOUNTER — Other Ambulatory Visit (HOSPITAL_COMMUNITY): Payer: Managed Care, Other (non HMO) | Admitting: Psychiatry

## 2013-08-09 ENCOUNTER — Encounter (HOSPITAL_COMMUNITY): Payer: Self-pay | Admitting: Psychiatry

## 2013-08-09 MED ORDER — LISDEXAMFETAMINE DIMESYLATE 60 MG PO CAPS: ORAL_CAPSULE | ORAL | Status: DC

## 2013-08-09 NOTE — Progress Notes (Signed)
Patient ID: [name], female   DOB: [birth year], 33 y.o.   MRN: [id number]  Patient was seen along with case manager Jeri Modenaita Clark, states that the increase in herVYVANSE 60 mg has helped her tremendously patient appears much calmer able to process well is not restless or fidgety, able to process better and her concentration is good. Sleep and appetite are good she denies suicidal or homicidal ideation. No hallucinations or delusions noted she is coping better. Patient wanted to discontinue her Lexapro but I informed her that it's only been 8 weeks and we need to give it 8-12 weeks for an adequate trial she stated understanding and is willing to continue.
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
## 2013-08-09 NOTE — Progress Notes (Unsigned)
    Daily Group Progress Note  Program: IOP  Group Time: 9:00-10:30  Participation Level: Active  Behavioral Response: Appropriate  Type of Therapy:  Group Therapy  Summary of Progress: Pt. Reported that she was doing ok, tired of not having energy and not feeling like her usual perky and energetic self. Pt. Focused on adjusting her ADHD medications so that she is not scattered, disorganized.     Group Time: 10:30-12:00  Participation Level:  Active  Behavioral Response: Appropriate  Type of Therapy: Psycho-education Group  Summary of Progress: Pt. Was alert and attentive during grief and loss facilitated by Theda BelfastBob Hamilton.  Shaune PollackBrown, Jennifer B, COUNS

## 2013-08-10 ENCOUNTER — Encounter (HOSPITAL_COMMUNITY): Payer: Self-pay | Admitting: Psychiatry

## 2013-08-10 ENCOUNTER — Other Ambulatory Visit (HOSPITAL_COMMUNITY): Payer: Managed Care, Other (non HMO) | Admitting: Psychiatry

## 2013-08-10 DIAGNOSIS — F319 Bipolar disorder, unspecified: Secondary | ICD-10-CM

## 2013-08-10 DIAGNOSIS — F988 Other specified behavioral and emotional disorders with onset usually occurring in childhood and adolescence: Secondary | ICD-10-CM

## 2013-08-10 NOTE — Progress Notes (Signed)
    Daily Group Progress Note  Program: IOP  Group Time: 9:00-10:30  Participation Level: Active  Behavioral Response: Appropriate  Type of Therapy:  Group Therapy  Summary of Progress: Pt. Reported that she is feeling "good". Pt. Reports that she continues to notice improvement in motivation and energy level which she attributes to adjustment in her ADHD medication.     Group Time: 10:30-12:00  Participation Level:  Active  Behavioral Response: Appropriate  Type of Therapy: Psycho-education Group  Summary of Progress: Pt. Was alert and attentive during discussion about positive self-esteem development and resisting comparison to others. Pt. Participated in reflective reading and meditation practice.  Shaune PollackBrown, Jennifer B, COUNS

## 2013-08-11 ENCOUNTER — Other Ambulatory Visit (HOSPITAL_COMMUNITY): Payer: Managed Care, Other (non HMO) | Admitting: Psychiatry

## 2013-08-11 ENCOUNTER — Encounter (HOSPITAL_COMMUNITY): Payer: Self-pay | Admitting: Psychiatry

## 2013-08-11 NOTE — Progress Notes (Signed)
    Daily Group Progress Note  Program: IOP  Group Time: 9:00-10:30  Participation Level: None  Behavioral Response: n/a  Type of Therapy:  Group Therapy  Summary of Progress: Pt. Was excused from first half of group for medical appointment.     Group Time: 10:30-12:00  Participation Level:  Active  Behavioral Response: Appropriate  Type of Therapy: Psycho-education Group  Summary of Progress: Pt. Was alert and attentive during presentation by mental health association.  Shaune PollackBrown, Jennifer B, COUNS

## 2013-08-12 ENCOUNTER — Encounter (HOSPITAL_COMMUNITY): Payer: Self-pay | Admitting: Psychiatry

## 2013-08-12 ENCOUNTER — Other Ambulatory Visit (HOSPITAL_COMMUNITY): Payer: Managed Care, Other (non HMO) | Admitting: Psychiatry

## 2013-08-12 DIAGNOSIS — F988 Other specified behavioral and emotional disorders with onset usually occurring in childhood and adolescence: Secondary | ICD-10-CM

## 2013-08-12 DIAGNOSIS — F3131 Bipolar disorder, current episode depressed, mild: Secondary | ICD-10-CM

## 2013-08-12 NOTE — Progress Notes (Signed)
Patient ID: Tilden DomeStephanie Dawn Franco, female   DOB: 1980/08/12, 33 y.o.   MRN: [id number] Patient seen face-to-face, states that she feels extremely tired irritable in the morning with no motivation or energy. Feels better after she takes the medicineVyvanse. Discussed taking her medicine the moment she wakes up and then getting out of bed and half hour later as the medicine begins to work she stated understanding. Overall she is doing significantly better her concentration is good the medicine last salty. Sleep and appetite are good. Denies suicidal or homicidal ideation and has no hallucinations or delusions and patient is very calm

## 2013-08-12 NOTE — Progress Notes (Signed)
    Daily Group Progress Note  Program: IOP  Group Time: 9:00-10:30  Participation Level: Active  Behavioral Response: Appropriate  Type of Therapy:  Group Therapy  Summary of Progress: Pt. Was 20 minutes late to group and apologized for over-sleeping. Pt. Shred that she has cut down on her cell phone use in order to engage with her family.      Group Time: 10:30-12:00  Participation Level:  Active  Behavioral Response: Appropriate  Type of Therapy: Psycho-education Group  Summary of Progress: Pt. Was alert and attentive during group on developing assertiveness and body language; watched Amy Cuddy video.  Shaune PollackBrown, Jennifer B, COUNS

## 2013-08-14 ENCOUNTER — Other Ambulatory Visit (HOSPITAL_COMMUNITY): Payer: Self-pay | Admitting: Psychiatry

## 2013-08-15 ENCOUNTER — Other Ambulatory Visit (HOSPITAL_COMMUNITY): Payer: Managed Care, Other (non HMO) | Admitting: Psychiatry

## 2013-08-15 ENCOUNTER — Other Ambulatory Visit (HOSPITAL_COMMUNITY): Payer: Self-pay | Admitting: Psychiatry

## 2013-08-15 ENCOUNTER — Encounter (HOSPITAL_COMMUNITY): Payer: Self-pay | Admitting: Psychiatry

## 2013-08-15 DIAGNOSIS — F3131 Bipolar disorder, current episode depressed, mild: Secondary | ICD-10-CM

## 2013-08-15 DIAGNOSIS — F988 Other specified behavioral and emotional disorders with onset usually occurring in childhood and adolescence: Secondary | ICD-10-CM

## 2013-08-15 NOTE — Progress Notes (Signed)
    Daily Group Progress Note  Program: IOP  Group Time: 9:00-10:30  Participation Level: Active  Behavioral Response: Appropriate  Type of Therapy:  Group Therapy  Summary of Progress: Pt. Reported that she was feeling "good" but did not sleep until 2:00 last night and was tired. Pt. Shared with the group that she has a job interview today, but continues to struggle with whether to leave current job because of the great benefits.      Group Time: 10:30-12:00  Participation Level:  Active  Behavioral Response: Appropriate  Type of Therapy: Psycho-education Group  Summary of Progress: Pt. Watched Enterprise ProductsCaroline McHugh video and participated in discussion about identifying interests, dreams, and joy in life.  Shaune PollackBrown, Sarha Bartelt B, COUNS

## 2013-08-15 NOTE — Telephone Encounter (Signed)
Given on 07/15/13 for 90 days. Too soon to refill

## 2013-08-16 ENCOUNTER — Encounter (HOSPITAL_COMMUNITY): Payer: Self-pay | Admitting: Psychiatry

## 2013-08-16 ENCOUNTER — Other Ambulatory Visit (HOSPITAL_COMMUNITY): Payer: Managed Care, Other (non HMO) | Admitting: Psychiatry

## 2013-08-16 DIAGNOSIS — F988 Other specified behavioral and emotional disorders with onset usually occurring in childhood and adolescence: Secondary | ICD-10-CM

## 2013-08-16 DIAGNOSIS — F3131 Bipolar disorder, current episode depressed, mild: Secondary | ICD-10-CM

## 2013-08-16 NOTE — Progress Notes (Signed)
    Daily Group Progress Note  Program: IOP  Group Time: 9:00-10:30  Participation Level: Active  Behavioral Response: Appropriate  Type of Therapy:  Group Therapy  Summary of Progress: Pt. Prepared for discharge. Pt. Participated in reflective reading and meditation exercise. Pt. Shared relationship with husband, pattern of excessive care giving and co-dependency, resentment toward husband for not taking a more active role in the household.     Group Time: 10:30-12:00  Participation Level:  Active  Behavioral Response: Appropriate  Type of Therapy: Psycho-education Group  Summary of Progress: Pt. Participated in grief and loss group.  Shaune PollackBrown, Jennifer B, COUNS

## 2013-08-16 NOTE — Progress Notes (Signed)
Heather BastaStephanie Dawn Mliss Franco is a 33 y.o. , married, Caucasian, female, who was referred per Dr. Lolly MustacheArfeen, treatment for ongoing depressive and anxiety symptoms (i.e.. Racing thoughts, decreased energy, motivation, and concentration). Denies SI/HI or A/V hallucinations. Stressors: 1) Job ([employer]) of twelve years. Pt works within the [employer] department. States she feels like she has burned out. "I use to like my job, but now I don't like working there." Pt apparently has no interactions with the public, and she stated that may be the problem. "I need to be able to be around and interact with people." 2) Mother: Patient's mother's husband had an affair and has left; making mother all alone. According to pt, he use to take care of her mother. "She now wants me to take care of her." Pt states she has been working on setting boundaries.  Pt states she has had one previous psychiatric admission [facility] in 9th grade). Denies any previous suicide attempts. Has been seeing Dr. Lolly MustacheArfeen for years and [name]. Family Hx: Mother (Bipolar) and Father (ETOH)  Pt completed MH-IOP today.  Reports improved sleep and appetite.  Continues to struggle with low energy and motivation.  States that the groups were "nice."  Would like to continue working on stress management, self care, and boundaries.  A:  D/C today.  F/U with Dr. Lolly MustacheArfeen on 08-17-13 @ 8:30 a.m and [name] on 08-31-13 @ 3:30pm.  RTW on 09-05-13 without any restrictions.  Encouraged support groups.  R:  Pt receptive.
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
## 2013-08-16 NOTE — Patient Instructions (Signed)
Patient completed MH-IOP today.  Will follow up with Dr. Lolly MustacheArfeen on 08-17-13 @ 8:30 a.m and [name], [facility] on 08-31-13 @ 3:30 pm.  Encouraged support groups.  Return to work on 09-05-13, without any restrictions.

## 2013-08-17 ENCOUNTER — Encounter (HOSPITAL_COMMUNITY): Payer: Self-pay | Admitting: Psychiatry

## 2013-08-17 ENCOUNTER — Ambulatory Visit (INDEPENDENT_AMBULATORY_CARE_PROVIDER_SITE_OTHER): Payer: Managed Care, Other (non HMO) | Admitting: Psychiatry

## 2013-08-17 ENCOUNTER — Other Ambulatory Visit (HOSPITAL_COMMUNITY): Payer: Managed Care, Other (non HMO)

## 2013-08-17 VITALS — BP 132/80 | HR 96 | Ht 61.0 in | Wt 136.0 lb

## 2013-08-17 DIAGNOSIS — F3130 Bipolar disorder, current episode depressed, mild or moderate severity, unspecified: Secondary | ICD-10-CM

## 2013-08-17 DIAGNOSIS — F988 Other specified behavioral and emotional disorders with onset usually occurring in childhood and adolescence: Secondary | ICD-10-CM

## 2013-08-17 MED ORDER — LISDEXAMFETAMINE DIMESYLATE 60 MG PO CAPS: ORAL_CAPSULE | ORAL | Status: DC
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
## 2013-08-18 ENCOUNTER — Ambulatory Visit (HOSPITAL_COMMUNITY): Payer: Self-pay | Admitting: Psychiatry

## 2013-08-18 ENCOUNTER — Other Ambulatory Visit (HOSPITAL_COMMUNITY): Payer: Managed Care, Other (non HMO)

## 2013-08-19 ENCOUNTER — Other Ambulatory Visit (HOSPITAL_COMMUNITY): Payer: Managed Care, Other (non HMO)

## 2013-08-22 ENCOUNTER — Ambulatory Visit (HOSPITAL_COMMUNITY): Payer: Self-pay | Admitting: Psychiatry

## 2013-08-22 ENCOUNTER — Other Ambulatory Visit (HOSPITAL_COMMUNITY): Payer: Managed Care, Other (non HMO)

## 2013-08-23 ENCOUNTER — Other Ambulatory Visit (HOSPITAL_COMMUNITY): Payer: Managed Care, Other (non HMO)

## 2013-08-24 ENCOUNTER — Other Ambulatory Visit (HOSPITAL_COMMUNITY): Payer: Managed Care, Other (non HMO)

## 2013-08-25 ENCOUNTER — Other Ambulatory Visit (HOSPITAL_COMMUNITY): Payer: Managed Care, Other (non HMO)

## 2013-08-29 ENCOUNTER — Other Ambulatory Visit (HOSPITAL_COMMUNITY): Payer: Managed Care, Other (non HMO)

## 2013-08-30 ENCOUNTER — Other Ambulatory Visit (HOSPITAL_COMMUNITY): Payer: Managed Care, Other (non HMO)

## 2013-08-31 ENCOUNTER — Other Ambulatory Visit (HOSPITAL_COMMUNITY): Payer: Managed Care, Other (non HMO)

## 2013-08-31 ENCOUNTER — Ambulatory Visit (INDEPENDENT_AMBULATORY_CARE_PROVIDER_SITE_OTHER): Payer: Managed Care, Other (non HMO) | Admitting: Psychiatry

## 2013-08-31 DIAGNOSIS — F988 Other specified behavioral and emotional disorders with onset usually occurring in childhood and adolescence: Secondary | ICD-10-CM

## 2013-08-31 DIAGNOSIS — F3131 Bipolar disorder, current episode depressed, mild: Secondary | ICD-10-CM

## 2013-09-01 ENCOUNTER — Other Ambulatory Visit (HOSPITAL_COMMUNITY): Payer: Managed Care, Other (non HMO)

## 2013-09-01 ENCOUNTER — Ambulatory Visit (INDEPENDENT_AMBULATORY_CARE_PROVIDER_SITE_OTHER): Payer: Managed Care, Other (non HMO) | Admitting: Psychiatry

## 2013-09-01 ENCOUNTER — Encounter (HOSPITAL_COMMUNITY): Payer: Self-pay | Admitting: Psychiatry

## 2013-09-01 VITALS — BP 140/90 | HR 86 | Wt 137.0 lb

## 2013-09-01 DIAGNOSIS — F3130 Bipolar disorder, current episode depressed, mild or moderate severity, unspecified: Secondary | ICD-10-CM

## 2013-09-01 DIAGNOSIS — F3131 Bipolar disorder, current episode depressed, mild: Secondary | ICD-10-CM

## 2013-09-01 NOTE — Progress Notes (Signed)
   THERAPIST PROGRESS NOTE  Session Time: 3:30-4:20   Participation Level: Active   Behavioral Response: CasualAlertEuthymic   Type of Therapy: Individual Therapy   Treatment Goals addressed: emotion regulation, stress management, communication skills   Interventions: CBT   Summary: [name] is a 33 y.o. female who presents with depression.   Suicidal/Homicidal: Nowithout intent/plan   Therapist Response: Pt. Continues to present as energetic, makes appropriate eye contact, talkative. Pt. Reports that her depression is improved since IOP. Pt. Reports that she has renewed gratitude for her life and understands that her life circumstances are not so bad when compared to others. Pt. Reports that she benefitted from focus on coping skills and reviewing her distorted cognitive patterns such as all or nothing thinking. Pt. Continues to process relationship with her mother and setting healthy space and time boundaries with her mother.   Plan: Pt. To continue with CBT based treatment. Return again in 2 weeks.   Diagnosis: Axis I: Depressive Disorder NOS   Axis II: No diagnosis     Wynonia MustyBrown, Jennifer B, COUNS 09/01/2013
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
## 2013-09-02 ENCOUNTER — Other Ambulatory Visit (HOSPITAL_COMMUNITY): Payer: Self-pay | Admitting: Psychiatry

## 2013-09-02 ENCOUNTER — Other Ambulatory Visit (HOSPITAL_COMMUNITY): Payer: Managed Care, Other (non HMO)

## 2013-09-02 NOTE — Telephone Encounter (Signed)
Given new script of higher dose on 07/15/13. Too soon to refill

## 2013-09-05 ENCOUNTER — Other Ambulatory Visit (HOSPITAL_COMMUNITY): Payer: Managed Care, Other (non HMO)

## 2013-09-06 ENCOUNTER — Telehealth (HOSPITAL_COMMUNITY): Payer: Self-pay | Admitting: *Deleted

## 2013-09-06 ENCOUNTER — Other Ambulatory Visit (HOSPITAL_COMMUNITY): Payer: Managed Care, Other (non HMO)

## 2013-09-06 DIAGNOSIS — F988 Other specified behavioral and emotional disorders with onset usually occurring in childhood and adolescence: Secondary | ICD-10-CM

## 2013-09-06 MED ORDER — LISDEXAMFETAMINE DIMESYLATE 60 MG PO CAPS: 60.0000 mg | ORAL_CAPSULE | ORAL | Status: DC

## 2013-09-06 NOTE — Telephone Encounter (Signed)
Pharmacist left WU:[id number]:Need to clarify Vyvanse RX.Sig is 60 mg BID-daily dose would be 120 mg.This exceeds safe limit.Please call. Reviewed order with MD.Per Dr. Lolly MustacheArfeen, prescription is incorrect. Should read Vyvanse 60 mg, once daily. Contacted pharmacy and gave new directions and corrected in pt chart.

## 2013-09-07 ENCOUNTER — Other Ambulatory Visit (HOSPITAL_COMMUNITY): Payer: Managed Care, Other (non HMO)

## 2013-09-08 ENCOUNTER — Other Ambulatory Visit (HOSPITAL_COMMUNITY): Payer: Managed Care, Other (non HMO)

## 2013-09-09 ENCOUNTER — Other Ambulatory Visit (HOSPITAL_COMMUNITY): Payer: Managed Care, Other (non HMO)

## 2013-09-12 ENCOUNTER — Other Ambulatory Visit (HOSPITAL_COMMUNITY): Payer: Managed Care, Other (non HMO)

## 2013-09-13 ENCOUNTER — Other Ambulatory Visit (HOSPITAL_COMMUNITY): Payer: Managed Care, Other (non HMO)

## 2013-09-14 ENCOUNTER — Ambulatory Visit (INDEPENDENT_AMBULATORY_CARE_PROVIDER_SITE_OTHER): Payer: Managed Care, Other (non HMO) | Admitting: Psychiatry

## 2013-09-14 DIAGNOSIS — F988 Other specified behavioral and emotional disorders with onset usually occurring in childhood and adolescence: Secondary | ICD-10-CM

## 2013-09-14 DIAGNOSIS — F3131 Bipolar disorder, current episode depressed, mild: Secondary | ICD-10-CM

## 2013-09-15 NOTE — Progress Notes (Signed)
   THERAPIST PROGRESS NOTE  Session Time: 3:30-4:20   Participation Level: Active   Behavioral Response: CasualAlertEuthymic   Type of Therapy: Individual Therapy   Treatment Goals addressed: emotion regulation, stress management, communication skills   Interventions: CBT   Summary: [name] is a 33 y.o. female who presents with depression.   Suicidal/Homicidal: Nowithout intent/plan   Therapist Response: Pt. Continues to present as energetic, makes appropriate eye contact, talkative. Pt. Reports that she returned to work successfully this week. Pt. Reports that she continues to set healthy boundaries with her mother and that as a consequence her mother has become less dependent on her caregiving. Pt. Reports that her communication is also improving with her husband and she is able to be more assertive with him in having her emotional needs met. Pt. Reports that she is in a much improved place at work evidenced by her ability to manage work stress and not take it home and open to taking on leadership opportunities at work. Pt. Reports that she is sleeping well. Pt. Reports that she continues to work on boundary setting in her relationship with her father.  Plan: Pt. To continue with CBT based treatment. Return again in 2 weeks.   Diagnosis: Axis I: Depressive Disorder NOS   Axis II: No diagnosis      Renford Dills 09/15/2013

## 2013-09-19 ENCOUNTER — Other Ambulatory Visit (HOSPITAL_COMMUNITY): Payer: Self-pay | Admitting: Psychiatry

## 2013-09-20 ENCOUNTER — Ambulatory Visit (HOSPITAL_COMMUNITY): Payer: Self-pay | Admitting: Psychiatry

## 2013-09-20 ENCOUNTER — Other Ambulatory Visit (HOSPITAL_COMMUNITY): Payer: Self-pay | Admitting: Psychiatry

## 2013-09-26 ENCOUNTER — Other Ambulatory Visit (HOSPITAL_COMMUNITY): Payer: Self-pay | Admitting: Psychiatry

## 2013-09-28 ENCOUNTER — Other Ambulatory Visit (HOSPITAL_COMMUNITY): Payer: Self-pay | Admitting: Psychiatry

## 2013-09-28 ENCOUNTER — Ambulatory Visit (HOSPITAL_COMMUNITY): Payer: Self-pay | Admitting: Psychiatry

## 2013-10-03 ENCOUNTER — Encounter (HOSPITAL_COMMUNITY): Payer: Self-pay | Admitting: Psychiatry

## 2013-10-03 ENCOUNTER — Ambulatory Visit (INDEPENDENT_AMBULATORY_CARE_PROVIDER_SITE_OTHER): Payer: Managed Care, Other (non HMO) | Admitting: Psychiatry

## 2013-10-03 VITALS — BP 128/80 | Resp 18 | Wt 142.0 lb

## 2013-10-03 DIAGNOSIS — F988 Other specified behavioral and emotional disorders with onset usually occurring in childhood and adolescence: Secondary | ICD-10-CM

## 2013-10-03 DIAGNOSIS — F3130 Bipolar disorder, current episode depressed, mild or moderate severity, unspecified: Secondary | ICD-10-CM

## 2013-10-03 DIAGNOSIS — F3131 Bipolar disorder, current episode depressed, mild: Secondary | ICD-10-CM

## 2013-10-03 MED ORDER — LISDEXAMFETAMINE DIMESYLATE 30 MG PO CAPS: ORAL_CAPSULE | ORAL | Status: DC

## 2013-10-03 MED ORDER — ESCITALOPRAM OXALATE 20 MG PO TABS: 20.0000 mg | ORAL_TABLET | Freq: Every day | ORAL | Status: DC
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
## 2013-10-07 ENCOUNTER — Ambulatory Visit (INDEPENDENT_AMBULATORY_CARE_PROVIDER_SITE_OTHER): Payer: Managed Care, Other (non HMO) | Admitting: Psychiatry

## 2013-10-07 DIAGNOSIS — F3131 Bipolar disorder, current episode depressed, mild: Secondary | ICD-10-CM

## 2013-10-07 DIAGNOSIS — F988 Other specified behavioral and emotional disorders with onset usually occurring in childhood and adolescence: Secondary | ICD-10-CM
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
## 2013-10-12 ENCOUNTER — Ambulatory Visit (HOSPITAL_COMMUNITY): Payer: Self-pay | Admitting: Psychiatry

## 2013-10-14 ENCOUNTER — Ambulatory Visit (HOSPITAL_COMMUNITY): Payer: Self-pay | Admitting: Psychiatry

## 2013-10-24 ENCOUNTER — Other Ambulatory Visit (HOSPITAL_COMMUNITY): Payer: Self-pay | Admitting: *Deleted

## 2013-10-24 DIAGNOSIS — F988 Other specified behavioral and emotional disorders with onset usually occurring in childhood and adolescence: Secondary | ICD-10-CM

## 2013-10-24 MED ORDER — LISDEXAMFETAMINE DIMESYLATE 30 MG PO CAPS: ORAL_CAPSULE | ORAL | Status: DC

## 2013-10-24 NOTE — Telephone Encounter (Signed)
Patient left UJ:[id number] refill of Vyvanse and checking on progress on FMLA. States would like to pick up RX on Wednesday when she has an appointment so she does not have to take off work twice.FMLA can be faxed to [employer] (number on form).

## 2013-10-26 ENCOUNTER — Ambulatory Visit (INDEPENDENT_AMBULATORY_CARE_PROVIDER_SITE_OTHER): Payer: Managed Care, Other (non HMO) | Admitting: Psychiatry

## 2013-10-26 ENCOUNTER — Telehealth (HOSPITAL_COMMUNITY): Payer: Self-pay

## 2013-10-26 DIAGNOSIS — F988 Other specified behavioral and emotional disorders with onset usually occurring in childhood and adolescence: Secondary | ICD-10-CM

## 2013-10-26 DIAGNOSIS — F3131 Bipolar disorder, current episode depressed, mild: Secondary | ICD-10-CM

## 2013-10-26 NOTE — Telephone Encounter (Signed)
10/26/13 3:39pm Patient pick-up rx script.Marland KitchenMarguerite Olea

## 2013-10-27 NOTE — Progress Notes (Signed)
   THERAPIST PROGRESS NOTE  Session Time: 3:30-4:30   Participation Level: Active   Behavioral Response: CasualAlertEuthymic   Type of Therapy: Individual Therapy   Treatment Goals addressed: emotion regulation, stress management, communication skills   Interventions: CBT   Summary: [name] is a 33 y.o. female who presents with depression.   Suicidal/Homicidal: Nowithout intent/plan   Therapist Response: Pt. Continues to presents euthymic mood, bright affect. makes appropriate eye contact, talkative. Pt. 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 Pt. Reports that these are the activities that feed her spirit and keep her encouraged in her job. Pt. Reports that she and her husband are communicating well and she is happy in her marriage. Pt. Reports that she continues to make progress in setting healthy boundaries in relationship with her mother and with her father. Pt. Has made significant progress towards her treatment goals related to mood regulation and conflict resolution communication skills including developing greater frustration tolerance in work situations, developing greater ability to enter conflict oriented situations with less stress. Pt. Reports that she consistently has been able to focus on her breathing and her thoughts about acceptance of others in order to meet high stress situations with less distress. Time spent in session spent discussing progress toward goals and moving sessions to once a month for maintenance.  Plan: Pt. To continue with CBT based treatment. Return again in 4 weeks.   Diagnosis: Axis I: Depressive Disorder NOS   Axis II: No diagnosis        [name] 10/27/2013

## 2013-11-07 ENCOUNTER — Ambulatory Visit (HOSPITAL_COMMUNITY): Payer: Self-pay | Admitting: Psychiatry

## 2013-11-29 ENCOUNTER — Telehealth (HOSPITAL_COMMUNITY): Payer: Self-pay

## 2013-11-29 ENCOUNTER — Other Ambulatory Visit (HOSPITAL_COMMUNITY): Payer: Self-pay | Admitting: Psychiatry

## 2013-11-29 DIAGNOSIS — F988 Other specified behavioral and emotional disorders with onset usually occurring in childhood and adolescence: Secondary | ICD-10-CM

## 2013-11-29 MED ORDER — LISDEXAMFETAMINE DIMESYLATE 30 MG PO CAPS: ORAL_CAPSULE | ORAL | Status: DC

## 2013-11-30 ENCOUNTER — Telehealth (HOSPITAL_COMMUNITY): Payer: Self-pay

## 2013-11-30 NOTE — Telephone Encounter (Signed)
11/30/13 4:26pm Patient came and pick up rx script [id number] #[id number]

## 2013-12-05 ENCOUNTER — Encounter (HOSPITAL_COMMUNITY): Payer: Self-pay | Admitting: Psychiatry

## 2013-12-05 ENCOUNTER — Ambulatory Visit (INDEPENDENT_AMBULATORY_CARE_PROVIDER_SITE_OTHER): Payer: Managed Care, Other (non HMO) | Admitting: Psychiatry

## 2013-12-05 VITALS — BP 126/88 | HR 84 | Wt 142.0 lb

## 2013-12-05 DIAGNOSIS — F3131 Bipolar disorder, current episode depressed, mild: Secondary | ICD-10-CM

## 2013-12-05 DIAGNOSIS — F988 Other specified behavioral and emotional disorders with onset usually occurring in childhood and adolescence: Secondary | ICD-10-CM

## 2013-12-05 DIAGNOSIS — F3130 Bipolar disorder, current episode depressed, mild or moderate severity, unspecified: Secondary | ICD-10-CM

## 2013-12-05 MED ORDER — ESCITALOPRAM OXALATE 20 MG PO TABS: 20.0000 mg | ORAL_TABLET | Freq: Every day | ORAL | Status: DC

## 2013-12-05 MED ORDER — LAMOTRIGINE 150 MG PO TABS: 150.0000 mg | ORAL_TABLET | Freq: Two times a day (BID) | ORAL | Status: DC

## 2013-12-05 MED ORDER — QUETIAPINE FUMARATE 50 MG PO TABS: 50.0000 mg | ORAL_TABLET | Freq: Every day | ORAL | Status: DC

## 2013-12-05 MED ORDER — LISDEXAMFETAMINE DIMESYLATE 30 MG PO CAPS: ORAL_CAPSULE | ORAL | Status: DC
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
## 2013-12-06 ENCOUNTER — Other Ambulatory Visit (HOSPITAL_COMMUNITY): Payer: Self-pay | Admitting: Psychiatry

## 2013-12-07 ENCOUNTER — Ambulatory Visit (INDEPENDENT_AMBULATORY_CARE_PROVIDER_SITE_OTHER): Payer: Managed Care, Other (non HMO) | Admitting: Psychiatry

## 2013-12-07 DIAGNOSIS — F3131 Bipolar disorder, current episode depressed, mild: Secondary | ICD-10-CM

## 2013-12-07 DIAGNOSIS — F909 Attention-deficit hyperactivity disorder, unspecified type: Secondary | ICD-10-CM

## 2013-12-07 DIAGNOSIS — F988 Other specified behavioral and emotional disorders with onset usually occurring in childhood and adolescence: Secondary | ICD-10-CM

## 2013-12-08 NOTE — Progress Notes (Signed)
   THERAPIST PROGRESS NOTE  Session Time: 3:30-4:30   Participation Level: Active   Behavioral Response: CasualAlertEuthymic   Type of Therapy: Individual Therapy   Treatment Goals addressed: emotion regulation, stress management, communication skills   Interventions: CBT   Summary: [name] is a 33 y.o. female who presents with depression.   Suicidal/Homicidal: Nowithout intent/plan   Therapist Response: Pt. Continues to present with euthymic mood, talks and laughs appropriately, makes good eye contact. Pt. Reports that her relationships with her mother, father, and brother are much improved. Pt. Reports that her mother has become more independent and that her father is acknowledging chronic health concerns. Pt. Continues to process feelings of excessive responsibility for her father's health maintenance and is challenged by need to accept the limits of what she can do for him. Pt. Reports joy in her husband and relationship with her son. Pt. Reports that she is managing stress at work much better and is encouraged by her ability to actively participate in volunteering with mobile meals, at her church, and with her son's school. Pt. Reports that she has intentionally gained weight because she had lost too much in the past few months. Pt. Reports that she is sleeping well, appetite is good, but would like to get more physical exercise. Discussed discharge planning briefly, Pt. Feels need to continue counseling for health maintenance and to maintain accountability with boundary enforcement and self-care.   Plan: Pt. To continue with CBT based treatment. Return again in 4 weeks.   Diagnosis: Axis I: Depressive Disorder NOS   Axis II: No diagnosis    Wynonia MustyBrown, Jennifer B, [facility] 12/08/2013

## 2013-12-21 ENCOUNTER — Ambulatory Visit (HOSPITAL_COMMUNITY): Payer: Self-pay | Admitting: Psychiatry

## 2014-01-04 ENCOUNTER — Ambulatory Visit (INDEPENDENT_AMBULATORY_CARE_PROVIDER_SITE_OTHER): Payer: Managed Care, Other (non HMO) | Admitting: Psychiatry

## 2014-01-04 DIAGNOSIS — F988 Other specified behavioral and emotional disorders with onset usually occurring in childhood and adolescence: Secondary | ICD-10-CM

## 2014-01-04 DIAGNOSIS — F909 Attention-deficit hyperactivity disorder, unspecified type: Secondary | ICD-10-CM

## 2014-01-04 DIAGNOSIS — F3131 Bipolar disorder, current episode depressed, mild: Secondary | ICD-10-CM

## 2014-01-06 NOTE — Progress Notes (Signed)
   THERAPIST PROGRESS NOTE  Session Time: 3:30-4:30   Participation Level: Active   Behavioral Response: CasualAlertEuthymic   Type of Therapy: Individual Therapy   Treatment Goals addressed: emotion regulation, stress management, communication skills   Interventions: CBT   Summary: [name] is a 33 y.o. female who presents with depression.   Suicidal/Homicidal: Nowithout intent/plan   Therapist Response: Pt. Continues to present with euthymic mood, talks and laughs appropriately, makes good eye contact. Pt. Discussed recent interactions with her mother, developing new pattern of healthier boundaries with her mother, history of distrust in relationship with her mother. Significant part of session spent normalizing discomfort in relationship with her mother as she and her mother are developing new boundaries and new ways of interacting with each other.   Plan: Pt. To continue with CBT based treatment. Return again in 4 weeks.   Diagnosis: Axis I: Depressive Disorder NOS   Axis II: No diagnosis    Shaune PollackBrown, Jennifer B, [facility] 01/06/2014

## 2014-01-18 ENCOUNTER — Ambulatory Visit (HOSPITAL_COMMUNITY): Payer: Self-pay | Admitting: Psychiatry

## 2014-01-23 ENCOUNTER — Telehealth (HOSPITAL_COMMUNITY): Payer: Self-pay

## 2014-01-23 ENCOUNTER — Other Ambulatory Visit (HOSPITAL_COMMUNITY): Payer: Self-pay | Admitting: *Deleted

## 2014-01-23 DIAGNOSIS — F988 Other specified behavioral and emotional disorders with onset usually occurring in childhood and adolescence: Secondary | ICD-10-CM

## 2014-01-23 MED ORDER — LISDEXAMFETAMINE DIMESYLATE 30 MG PO CAPS: ORAL_CAPSULE | ORAL | Status: DC

## 2014-01-23 NOTE — Telephone Encounter (Signed)
RX for Vyvanse reprinted -did not print first time

## 2014-01-24 ENCOUNTER — Telehealth (HOSPITAL_COMMUNITY): Payer: Self-pay | Admitting: Psychiatry

## 2014-01-24 ENCOUNTER — Other Ambulatory Visit (HOSPITAL_COMMUNITY): Payer: Self-pay | Admitting: Psychiatry

## 2014-01-24 ENCOUNTER — Other Ambulatory Visit (HOSPITAL_COMMUNITY): Payer: Self-pay | Admitting: *Deleted

## 2014-01-24 DIAGNOSIS — F3131 Bipolar disorder, current episode depressed, mild: Secondary | ICD-10-CM

## 2014-01-24 MED ORDER — QUETIAPINE FUMARATE 100 MG PO TABS: 100.0000 mg | ORAL_TABLET | Freq: Every day | ORAL | Status: DC

## 2014-01-24 NOTE — Telephone Encounter (Signed)
I returned patient's phone call.  She is complaining of poor sleep and sometimes she has to take second Seroquel.  She is not sure what causing insomnia.  She had a good Thanksgiving.  She will discuss this issue more in detail on her next appointment.  She had appointment in January.  She need a higher dose of Seroquel since she is out of 50 mg as she is taking sometimes 2 tablet.  She denies any side effects of medication.  I will provide a new prescription of Seroquel 100 mg.  Recommended to call us back if she has any question or if she feels worsening of the symptoms.

## 2014-01-24 NOTE — Telephone Encounter (Signed)
Call r/t and left message to call us back. 

## 2014-01-24 NOTE — Telephone Encounter (Signed)
MD printed new prescription for Seroquel, meant to send electronically. Requested it be sent to pharmacy escript

## 2014-01-26 ENCOUNTER — Telehealth (HOSPITAL_COMMUNITY): Payer: Self-pay

## 2014-01-26 NOTE — Telephone Encounter (Signed)
01/26/14 4:01pm Patient came and pick-up rx script  UE#[id number].Marland Kitchen.[name]/sh

## 2014-02-01 ENCOUNTER — Ambulatory Visit (HOSPITAL_COMMUNITY): Payer: Self-pay | Admitting: Psychiatry

## 2014-02-15 ENCOUNTER — Ambulatory Visit (HOSPITAL_COMMUNITY): Payer: Self-pay | Admitting: Psychiatry

## 2014-02-22 ENCOUNTER — Other Ambulatory Visit (HOSPITAL_COMMUNITY): Payer: Self-pay | Admitting: Psychiatry

## 2014-02-23 ENCOUNTER — Telehealth (HOSPITAL_COMMUNITY): Payer: Self-pay

## 2014-02-23 ENCOUNTER — Other Ambulatory Visit (HOSPITAL_COMMUNITY): Payer: Self-pay | Admitting: *Deleted

## 2014-02-23 DIAGNOSIS — F988 Other specified behavioral and emotional disorders with onset usually occurring in childhood and adolescence: Secondary | ICD-10-CM

## 2014-02-23 MED ORDER — LISDEXAMFETAMINE DIMESYLATE 30 MG PO CAPS: ORAL_CAPSULE | ORAL | Status: DC

## 2014-02-23 NOTE — Telephone Encounter (Signed)
Pt called stating she is out of her Vyvanse and needed refills. Per pt she would like to pick up printed script today 02-23-14. Pt medication was last printed 01-23-14 with 60 capsules 0 refills. This is a pt of Dr. Lolly MustacheArfeen.

## 2014-02-23 NOTE — Addendum Note (Signed)
Addended by: Oletta DarterAGARWAL, Kareen Jefferys on: 02/23/2014 10:11 AM   Modules accepted: Orders

## 2014-02-27 ENCOUNTER — Ambulatory Visit (INDEPENDENT_AMBULATORY_CARE_PROVIDER_SITE_OTHER): Payer: Managed Care, Other (non HMO) | Admitting: Psychiatry

## 2014-02-27 ENCOUNTER — Telehealth (HOSPITAL_COMMUNITY): Payer: Self-pay

## 2014-02-27 DIAGNOSIS — F3131 Bipolar disorder, current episode depressed, mild: Secondary | ICD-10-CM

## 2014-02-27 NOTE — Telephone Encounter (Signed)
02/27/14 Patient pick-up rx script [id number] DL.Marland KitchenMarguerite Olea

## 2014-03-01 NOTE — Progress Notes (Signed)
   THERAPIST PROGRESS NOTE  Session Time: 3:00-4:00  Participation Level: Active   Behavioral Response: CasualAlertEuthymic   Type of Therapy: Individual Therapy   Treatment Goals addressed: emotion regulation, stress management, communication skills   Interventions: CBT   Summary: [name] is a 34 y.o. female who presents with depression.   Suicidal/Homicidal: Nowithout intent/plan   Therapist Response: Pt. Presents with euthymic mood. Pt. Reports that she was able to have a positive Thanksgiving and Christmas holiday. Pt. Discussed patterns of people pleasing during the holidays and stress caused by wanting everyone to have "perfect Christmas". Pt. Discussed ongoing stress between herself and her brother, marked by value differences and differences in how they are raising their children. Pt. Discussed ongoing challenge of relationships with co-dependent parents, but Pt. Reports continued progress in managing the relationships by setting healthy boundaries and saying "No" when she feels overwhelmed. Session focused on encouraging self-care (i.e., time alone, breathing, meditation) and validation of continued progress with boundary development and enforcement.   Plan: Pt. To continue with CBT based treatment. Return again in 4 weeks.   Diagnosis: Axis I: Depressive Disorder NOS   Axis II: No diagnosis     Shaune PollackBrown, Jennifer B, [facility] 03/01/2014

## 2014-03-03 ENCOUNTER — Other Ambulatory Visit (HOSPITAL_COMMUNITY): Payer: Self-pay | Admitting: Psychiatry

## 2014-03-06 ENCOUNTER — Other Ambulatory Visit (HOSPITAL_COMMUNITY): Payer: Self-pay | Admitting: Psychiatry

## 2014-03-06 NOTE — Telephone Encounter (Signed)
Patient has appointment on Jan 15th

## 2014-03-08 ENCOUNTER — Encounter (HOSPITAL_COMMUNITY): Payer: Self-pay | Admitting: Psychiatry

## 2014-03-08 ENCOUNTER — Ambulatory Visit (INDEPENDENT_AMBULATORY_CARE_PROVIDER_SITE_OTHER): Payer: BLUE CROSS/BLUE SHIELD | Admitting: Psychiatry

## 2014-03-08 VITALS — BP 147/91 | HR 93 | Ht 61.0 in | Wt 147.2 lb

## 2014-03-08 DIAGNOSIS — F988 Other specified behavioral and emotional disorders with onset usually occurring in childhood and adolescence: Secondary | ICD-10-CM

## 2014-03-08 DIAGNOSIS — F3130 Bipolar disorder, current episode depressed, mild or moderate severity, unspecified: Secondary | ICD-10-CM

## 2014-03-08 MED ORDER — LISDEXAMFETAMINE DIMESYLATE 30 MG PO CAPS: ORAL_CAPSULE | ORAL | Status: DC
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
## 2014-04-05 ENCOUNTER — Other Ambulatory Visit (HOSPITAL_COMMUNITY): Payer: Self-pay | Admitting: Psychiatry

## 2014-04-05 DIAGNOSIS — F988 Other specified behavioral and emotional disorders with onset usually occurring in childhood and adolescence: Secondary | ICD-10-CM

## 2014-04-05 NOTE — Telephone Encounter (Signed)
Dr. Lolly MustacheArfeen authorized refill of 90 day supply since patient's next appointment not until 06/08/14 with instructions of no early refills to be placed on prescription. Order completed and e-scribed prescription verified received.

## 2014-04-11 ENCOUNTER — Ambulatory Visit (HOSPITAL_COMMUNITY): Payer: Self-pay | Admitting: Psychiatry

## 2014-04-20 ENCOUNTER — Other Ambulatory Visit (HOSPITAL_COMMUNITY): Payer: Self-pay | Admitting: Psychiatry

## 2014-04-20 NOTE — Telephone Encounter (Signed)
Refill of patient's Lamictal and Lexapro denied due to being requested too early.  Patient was given a 90 day supply order for both on 02/22/14.

## 2014-04-26 ENCOUNTER — Other Ambulatory Visit (HOSPITAL_COMMUNITY): Payer: Self-pay | Admitting: Psychiatry

## 2014-05-04 ENCOUNTER — Other Ambulatory Visit (HOSPITAL_COMMUNITY): Payer: Self-pay | Admitting: Psychiatry

## 2014-05-05 ENCOUNTER — Telehealth (HOSPITAL_COMMUNITY): Payer: Self-pay

## 2014-05-05 NOTE — Telephone Encounter (Signed)
Okay to refill all her medication.

## 2014-05-05 NOTE — Telephone Encounter (Signed)
Medication eefill requests- Pt. has appt. 06/08/14.  Last seen 03/08/14. Lamictal and Lexapro 90 day supply from 02/22/14 and xanax last order 03/08/14 - Okay to refill one time as previously written.

## 2014-05-05 NOTE — Telephone Encounter (Signed)
Telephone call with Demetrio LappingKala, pharmacist at [facility], [phone] in [place], to give authorized verbal order for patient's refill of alprazolam 0.25mg , 1 po as needed as directed, #30 with 0 refills per Dr. Sheela StackArfeen's request.  Also informed Dr. Lolly MustacheArfeen has e-scribed over new orders for patient's requested Lamictal and Lexapro this date.

## 2014-05-09 ENCOUNTER — Ambulatory Visit (HOSPITAL_COMMUNITY): Payer: Self-pay | Admitting: Psychiatry

## 2014-05-22 ENCOUNTER — Telehealth (HOSPITAL_COMMUNITY): Payer: Self-pay

## 2014-05-22 NOTE — Telephone Encounter (Signed)
Medication Refill requests - message from patient requesting a refill of her Vyvanse that could be filled last on 04/24/14.  Patient returns to see Dr. Lolly MustacheArfeen 06/08/14 but states she will be in on Thursday this week for therapy and would like to pick up new prescription then.

## 2014-05-23 ENCOUNTER — Telehealth (HOSPITAL_COMMUNITY): Payer: Self-pay

## 2014-05-23 ENCOUNTER — Other Ambulatory Visit (HOSPITAL_COMMUNITY): Payer: Self-pay | Admitting: Psychiatry

## 2014-05-23 DIAGNOSIS — F988 Other specified behavioral and emotional disorders with onset usually occurring in childhood and adolescence: Secondary | ICD-10-CM

## 2014-05-23 MED ORDER — LISDEXAMFETAMINE DIMESYLATE 30 MG PO CAPS: ORAL_CAPSULE | ORAL | Status: DC

## 2014-05-25 ENCOUNTER — Telehealth (HOSPITAL_COMMUNITY): Payer: Self-pay

## 2014-05-25 ENCOUNTER — Ambulatory Visit (INDEPENDENT_AMBULATORY_CARE_PROVIDER_SITE_OTHER): Payer: BLUE CROSS/BLUE SHIELD | Admitting: Psychiatry

## 2014-05-25 DIAGNOSIS — F988 Other specified behavioral and emotional disorders with onset usually occurring in childhood and adolescence: Secondary | ICD-10-CM

## 2014-05-25 DIAGNOSIS — F3131 Bipolar disorder, current episode depressed, mild: Secondary | ICD-10-CM | POA: Diagnosis not present

## 2014-05-25 DIAGNOSIS — F909 Attention-deficit hyperactivity disorder, unspecified type: Secondary | ICD-10-CM | POA: Diagnosis not present
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
## 2014-05-25 NOTE — Telephone Encounter (Signed)
05/25/14 1:19PM Patient came and pick-up rx script .DL [id number].Marland Kitchen.[name]/sh

## 2014-06-05 ENCOUNTER — Ambulatory Visit (INDEPENDENT_AMBULATORY_CARE_PROVIDER_SITE_OTHER): Payer: BLUE CROSS/BLUE SHIELD | Admitting: Psychiatry

## 2014-06-05 DIAGNOSIS — F909 Attention-deficit hyperactivity disorder, unspecified type: Secondary | ICD-10-CM | POA: Diagnosis not present

## 2014-06-05 DIAGNOSIS — F988 Other specified behavioral and emotional disorders with onset usually occurring in childhood and adolescence: Secondary | ICD-10-CM

## 2014-06-05 DIAGNOSIS — F3131 Bipolar disorder, current episode depressed, mild: Secondary | ICD-10-CM | POA: Diagnosis not present

## 2014-06-05 NOTE — Progress Notes (Signed)
   THERAPIST PROGRESS NOTE  Session Time: 1:15-2:15  Participation Level: Active   Behavioral Response: CasualAlertEuthymic   Type of Therapy: Individual Therapy   Treatment Goals addressed: emotion regulation, stress management, communication skills   Interventions: CBT   Summary: [name] is a 34 y.o. female who presents with depression.   Suicidal/Homicidal: Nowithout intent/plan   Therapist Response: Pt. Arrived 15 minutes late for session. Pt. Presented with euthymic mood, made appropriate eye contact, smiles and laughs appropriately. Pt. Continues to report that she is walking daily with her dog and has noticed increased ability to manage stress with walking. Pt. Continues to report that relationships with her mother and father are her most significant stressors, but realizes that her ability to set healthy boundaries in the relationships is important to her mother and father becoming triggers for anxiety. Pt. Reports that she limits time with both of them and continues to return responsibility for finances to her mother. Pt. Reports that she is doing well with work and grateful for her relationships with her manager who is flexible and appreciative of her work. Pt. Acknowledges pattern of control with household tasks i.e., grocery shopping and is not ready to give up control to her husband at this time and acknowledges that this control/perfectionism is likely contributing to her stress burden.  Plan: Pt. To continue with CBT based treatment. Return again in 4 weeks.   Diagnosis: Axis I: Depressive Disorder NOS   Axis II: No diagnosis    Shaune PollackBrown, Jennifer B, [facility] 06/05/2014

## 2014-06-06 ENCOUNTER — Ambulatory Visit (HOSPITAL_COMMUNITY): Payer: Self-pay | Admitting: Psychiatry

## 2014-06-08 ENCOUNTER — Ambulatory Visit (INDEPENDENT_AMBULATORY_CARE_PROVIDER_SITE_OTHER): Payer: BLUE CROSS/BLUE SHIELD | Admitting: Psychiatry

## 2014-06-08 ENCOUNTER — Encounter (HOSPITAL_COMMUNITY): Payer: Self-pay | Admitting: Psychiatry

## 2014-06-08 VITALS — BP 125/89 | HR 93 | Ht 61.0 in | Wt 160.2 lb

## 2014-06-08 DIAGNOSIS — F909 Attention-deficit hyperactivity disorder, unspecified type: Secondary | ICD-10-CM

## 2014-06-08 DIAGNOSIS — F988 Other specified behavioral and emotional disorders with onset usually occurring in childhood and adolescence: Secondary | ICD-10-CM

## 2014-06-08 MED ORDER — LISDEXAMFETAMINE DIMESYLATE 30 MG PO CAPS: ORAL_CAPSULE | ORAL | Status: DC

## 2014-06-08 NOTE — Progress Notes (Signed)
[facility] [id number] Progress Note  [name] [id number] 34 y.o.  06/08/2014 1:49 PM  Chief Complaint:  Medication management and followup.   History of Present Illness: [name] came for her followup appointment.  She is doing better on medication.  She started walking and doing regular exercise every every day.  She tried to walk at least 2 hours and she has noticed much improvement in her mood .  She sleeping good.  She still take Seroquel as when necessary.  She has not taken Xanax in a while .  Her last prescription was lost and she feels embarrassed about it .  She requested to call the prescription and now she is keeping the close eye on her medications.  Overall her mood has been stable.  She denies any irritability, crying spells, feeling hopeless or helpless.  She is seeing Boneta Lucks for counseling .  She has no tremors or shakes.  Her appetite is okay.  Her vitals are stable.  She is working at [employer] of [place] and there has been no new issues at work.  Patient denies drinking or using any illegal substances.  She lives with her husband .  Suicidal Ideation: No Plan Formed: No Patient has means to carry out plan: No  Homicidal Ideation: No Plan Formed: No Patient has means to carry out plan: No  ROS Psychiatric: Agitation: No Hallucination: No Depressed Mood: No Insomnia: No Hypersomnia: No Altered Concentration: No Feels Worthless: No Grandiose Ideas: No Belief In Special Powers: No New/Increased Substance Abuse: No Compulsions: No  Neurologic: Headache: No Seizure: No Paresthesias: No  Medical History:  Patient has a history of gastric bypass surgery.  Her primary care physician is Dr. Timothy Lasso.  Psychosocial history. Patient lives with her husband and children.  She is working in [place].  Outpatient Encounter Prescriptions as of 06/08/2014  Medication Sig  . ALPRAZolam (XANAX) 0.25 MG tablet TAKE 1 TABLET BY MOUTH AS NEEDED AS  DIRECTED  . clindamycin (CLINDAGEL) 1 % gel   . cyclobenzaprine (FLEXERIL) 10 MG tablet Take 10 mg by mouth 2 (two) times daily.  Marland Kitchen escitalopram (LEXAPRO) 20 MG tablet TAKE 1 TABLET BY MOUTH EVERY DAY  . etonogestrel-ethinyl estradiol (NUVARING) 0.12-0.015 MG/24HR vaginal ring Place 1 each vaginally every 28 (twenty-eight) days. Insert vaginally and leave in place for 3 consecutive weeks, then remove for 1 week.   . lamoTRIgine (LAMICTAL) 150 MG tablet TAKE 1 TABLET BY MOUTH TWICE A DAY  . lisdexamfetamine (VYVANSE) 30 MG capsule Take 1 in am and 2nd at noon  . QUEtiapine (SEROQUEL) 100 MG tablet TAKE 1 TABLET AT BEDTIME  . tretinoin (RETIN-A) 0.025 % cream   . ibuprofen (ADVIL,MOTRIN) 800 MG tablet   . [DISCONTINUED] lisdexamfetamine (VYVANSE) 30 MG capsule Take 1 in am and 2nd at noon  . [DISCONTINUED] lisdexamfetamine (VYVANSE) 30 MG capsule Take 1 in am and 2nd at noon    Past Psychiatric History/Hospitalization(s): Patient recently finished intensive outpatient program.  In the past she had tried Zyprexa and trazodone which did not help her.  She also cause weight gain from Zyprexa. Anxiety: Yes Bipolar Disorder: Yes Depression: Yes Mania: Yes Psychosis: No Schizophrenia: No Personality Disorder: No Hospitalization for psychiatric illness: Patient was admitted when she was in her teens.  Patient claims her stepfather was a reason she was admitted in the hospital.  She do not remember the details. History of Electroconvulsive Shock Therapy: No Prior Suicide Attempts: No  Physical Exam:  Constitutional:  BP 125/89 mmHg  Pulse 93  Ht 5\' 1"  (1.549 m)  Wt 160 lb 3.2 oz (72.666 kg)  BMI 30.29 kg/m2  LMP 06/05/2014 (Exact Date)  General Appearance: well nourished and Appears tired  Musculoskeletal: Strength & Muscle Tone: within normal limits Gait & Station: normal Patient leans: N/A  Mental status examination Patient is casually dressed and groomed.  She is pleasant and  cooperative. She maintained good eye contact.  She described her mood is good and her affect is mood appropriate.  Her speech is fast but relevant, coherent coherent.  Her thought process is logical and goal-directed.   She denies any auditory or visual hallucination.  She denies any active or passive suicidal thoughts or homicidal thoughts.  There were no paranoia or any delusions.  There were no tremors or shakes.  Her psychomotor activity is normal.  Her fund of knowledge is above average.  There were no flight of ideas or any loose association.  She's alert and oriented x3.  Her cognition is good.  She is alert and oriented x3.  Her insight judgment and impulse control is okay.  Established Problem, Stable/Improving (1), Review of Psycho-Social Stressors (1), Review of Last Therapy Session (1) and Review of Medication Regimen & Side Effects (2)  Assessment: Axis I: Bipolar disorder depressed type, rule out ADD  Axis II: Deferred  Axis III: History of gastric bypass surgery   Plan:  Patient is a stable on her current psychotropic medication.  She has no side effects.  I will continue Vyvanse 30 mg in the morning and 30 mg at noon time , Lamictal 150 mg twice a day, Seroquel 50 mg at bedtime and Lexapro 20 mg daily.  She has no rash or itching.  Discussed to keep her Xanax prescription locked .  She does not need a new prescription at this time.  Recommended to call us back if she has any question or any concern.  Recommended to keep appointment for [name] for counseling.  Followup in 3 months.  Braylea Brancato T., MD 06/08/2014

## 2014-06-29 ENCOUNTER — Other Ambulatory Visit (HOSPITAL_COMMUNITY): Payer: Self-pay | Admitting: Psychiatry

## 2014-06-29 NOTE — Telephone Encounter (Signed)
Refill request came for Seroquel.  Last office note in 06-08-14 stated patient taking Seroquel as needed.  I called patient and she stated she does not need refill still has plenty of medication.  Request denied.

## 2014-06-30 ENCOUNTER — Ambulatory Visit (INDEPENDENT_AMBULATORY_CARE_PROVIDER_SITE_OTHER): Payer: BLUE CROSS/BLUE SHIELD | Admitting: Psychiatry

## 2014-06-30 DIAGNOSIS — F3131 Bipolar disorder, current episode depressed, mild: Secondary | ICD-10-CM | POA: Diagnosis not present

## 2014-06-30 DIAGNOSIS — F909 Attention-deficit hyperactivity disorder, unspecified type: Secondary | ICD-10-CM | POA: Diagnosis not present

## 2014-06-30 DIAGNOSIS — F988 Other specified behavioral and emotional disorders with onset usually occurring in childhood and adolescence: Secondary | ICD-10-CM

## 2014-06-30 NOTE — Progress Notes (Signed)
   THERAPIST PROGRESS NOTE  Session Time: 3:00-4:00  Participation Level: Active   Behavioral Response: CasualAlertEuthymic   Type of Therapy: Individual Therapy   Treatment Goals addressed: emotion regulation, stress management, communication skills   Interventions: CBT   Summary: Heather DomeStephanie Dawn Franco is a 34 y.o. female who presents with depression.   Suicidal/Homicidal: Nowithout intent/plan   Therapist Response: Pt. Presents as talkative with euthymic mood. Pt. Reports that relationships with husband and son are good. Pt. Expresses gratitude for son who has been sensitive to her mental health diagnosis and husband's physical limitations due to back pain. Pt. Reports continued pattern of engaging in regular physical activity i.e., 1 1/2 hours a day of walking the dog. Pt. Reports that her dog has been significant source of emotional support and that she is proud of commitment that she has made in training the dog to be less aggressive around other animals. Pt. Also reports significant progress in developing friendship with woman who shares similar values and personality. Pt. Reports that she continues to have positive relationship with Production designer, theatre/television/filmmanager and was awarded presidential award for volunteering. Pt. Reports that relationship with her mother is poor; however, Pt. Has been successful in maintaining healthy boundaries with dependent mother. Most of session spent reviewing healthy boundaries/positive reinforcement for maintaining healthy boundaries and reviewing protective factors i.e., physical activity, relationship with her dog, and volunteering with mobile meals and at son's school.  Plan: Pt. To continue with CBT based treatment. Return again in 4 weeks.   Diagnosis: Axis I: Depressive Disorder NOS   Axis II: No diagnosis    Shaune PollackBrown, Raina Sole B, [facility] 06/30/2014

## 2014-07-11 ENCOUNTER — Ambulatory Visit (HOSPITAL_COMMUNITY): Payer: Self-pay | Admitting: Psychiatry

## 2014-07-28 ENCOUNTER — Other Ambulatory Visit (HOSPITAL_COMMUNITY): Payer: Self-pay | Admitting: Psychiatry

## 2014-07-28 DIAGNOSIS — F3131 Bipolar disorder, current episode depressed, mild: Secondary | ICD-10-CM

## 2014-08-02 NOTE — Telephone Encounter (Signed)
New 90 day prescriptions for patient's prescribed Lexapro and Lamictal approved by Dr. Lolly MustacheArfeen and [facility] [place]. Patient to return for next evaluation on

## 2014-08-15 ENCOUNTER — Ambulatory Visit (HOSPITAL_COMMUNITY): Payer: Self-pay | Admitting: Psychiatry

## 2014-08-16 ENCOUNTER — Ambulatory Visit (INDEPENDENT_AMBULATORY_CARE_PROVIDER_SITE_OTHER): Payer: BLUE CROSS/BLUE SHIELD | Admitting: Psychiatry

## 2014-08-16 DIAGNOSIS — F909 Attention-deficit hyperactivity disorder, unspecified type: Secondary | ICD-10-CM | POA: Diagnosis not present

## 2014-08-16 DIAGNOSIS — F988 Other specified behavioral and emotional disorders with onset usually occurring in childhood and adolescence: Secondary | ICD-10-CM

## 2014-08-16 DIAGNOSIS — F3131 Bipolar disorder, current episode depressed, mild: Secondary | ICD-10-CM | POA: Diagnosis not present

## 2014-08-18 NOTE — Progress Notes (Signed)
   THERAPIST PROGRESS NOTE  Session Time: 3::30-4:30  Participation Level: Active   Behavioral Response: CasualAlertEuthymic   Type of Therapy: Individual Therapy   Treatment Goals addressed: emotion regulation, stress management, communication skills   Interventions: CBT   Summary: [name] is a 34 y.o. female who presents with depression.   Suicidal/Homicidal: Nowithout intent/plan   Therapist Response: Pt. Continues to present with euthymic mood. Pt. Reports that most significant stressor is enforcing boundaries with severely dependent mother. Pt. Has recently engaged in interviewing family members i.e., mother's sisters to try to understand family history and how it shaped her mother's early personality and current behavior patterns. Pt. Expressed awareness that her mother's behavior was reinforced due to early signs of depression and her grandmother's depression. Pt.'s grandmother apparrently did not know how to respond to mother's periods of crying and she was allowed to avoid household responsibilities. Pt. Reports that she has been challenged by her mother and brother and sister-in-law when she has maintained boundaries with her mother which has forced other family members to take on more of an active role in care-giving for her mother. Pt. Continues to engage excessive communication and efforts to obligation and guilt. Pt. Reports that she continues to walk her dog regularly and encouraged by relationship with friend in [place] who has been significant source of support and acceptance.   Plan: Pt. To continue with CBT based treatment. Return again in 4 weeks.   Diagnosis: Axis I: Depressive Disorder NOS   Axis II: No diagnosis     Shaune Pollack, [facility] 08/18/2014

## 2014-08-30 ENCOUNTER — Ambulatory Visit (HOSPITAL_COMMUNITY): Payer: Self-pay | Admitting: Psychiatry

## 2014-08-31 ENCOUNTER — Ambulatory Visit (HOSPITAL_COMMUNITY): Payer: Self-pay | Admitting: Psychiatry

## 2014-09-07 ENCOUNTER — Ambulatory Visit (HOSPITAL_COMMUNITY): Payer: Self-pay | Admitting: Psychiatry

## 2014-09-18 ENCOUNTER — Ambulatory Visit (HOSPITAL_COMMUNITY): Payer: Self-pay | Admitting: Psychiatry

## 2014-09-27 ENCOUNTER — Ambulatory Visit (HOSPITAL_COMMUNITY): Payer: Self-pay | Admitting: Psychiatry

## 2014-11-01 ENCOUNTER — Ambulatory Visit (HOSPITAL_COMMUNITY): Payer: Self-pay | Admitting: Psychiatry

## 2014-11-29 ENCOUNTER — Ambulatory Visit (HOSPITAL_COMMUNITY): Payer: Self-pay | Admitting: Psychiatry

## 2014-12-27 ENCOUNTER — Ambulatory Visit (HOSPITAL_COMMUNITY): Payer: Self-pay | Admitting: Psychiatry

## 2015-01-31 ENCOUNTER — Ambulatory Visit (HOSPITAL_COMMUNITY): Payer: Self-pay | Admitting: Psychiatry

## 2015-05-30 DIAGNOSIS — F411 Generalized anxiety disorder: Secondary | ICD-10-CM | POA: Diagnosis not present

## 2015-05-30 DIAGNOSIS — F909 Attention-deficit hyperactivity disorder, unspecified type: Secondary | ICD-10-CM | POA: Diagnosis not present

## 2015-05-30 DIAGNOSIS — F3132 Bipolar disorder, current episode depressed, moderate: Secondary | ICD-10-CM | POA: Diagnosis not present

## 2015-06-05 DIAGNOSIS — F411 Generalized anxiety disorder: Secondary | ICD-10-CM | POA: Diagnosis not present

## 2015-06-26 DIAGNOSIS — F3132 Bipolar disorder, current episode depressed, moderate: Secondary | ICD-10-CM | POA: Diagnosis not present

## 2015-06-26 DIAGNOSIS — F411 Generalized anxiety disorder: Secondary | ICD-10-CM | POA: Diagnosis not present

## 2015-06-26 DIAGNOSIS — F909 Attention-deficit hyperactivity disorder, unspecified type: Secondary | ICD-10-CM | POA: Diagnosis not present

## 2015-06-27 DIAGNOSIS — F411 Generalized anxiety disorder: Secondary | ICD-10-CM | POA: Diagnosis not present

## 2015-06-27 DIAGNOSIS — F4310 Post-traumatic stress disorder, unspecified: Secondary | ICD-10-CM | POA: Diagnosis not present

## 2015-07-06 DIAGNOSIS — L237 Allergic contact dermatitis due to plants, except food: Secondary | ICD-10-CM | POA: Diagnosis not present

## 2015-07-11 DIAGNOSIS — F411 Generalized anxiety disorder: Secondary | ICD-10-CM | POA: Diagnosis not present

## 2015-07-11 DIAGNOSIS — F4310 Post-traumatic stress disorder, unspecified: Secondary | ICD-10-CM | POA: Diagnosis not present

## 2015-07-26 DIAGNOSIS — F909 Attention-deficit hyperactivity disorder, unspecified type: Secondary | ICD-10-CM | POA: Diagnosis not present

## 2015-07-26 DIAGNOSIS — F3132 Bipolar disorder, current episode depressed, moderate: Secondary | ICD-10-CM | POA: Diagnosis not present

## 2015-07-26 DIAGNOSIS — F411 Generalized anxiety disorder: Secondary | ICD-10-CM | POA: Diagnosis not present

## 2015-08-14 DIAGNOSIS — F4310 Post-traumatic stress disorder, unspecified: Secondary | ICD-10-CM | POA: Diagnosis not present

## 2015-08-14 DIAGNOSIS — F411 Generalized anxiety disorder: Secondary | ICD-10-CM | POA: Diagnosis not present

## 2015-09-18 DIAGNOSIS — F909 Attention-deficit hyperactivity disorder, unspecified type: Secondary | ICD-10-CM | POA: Diagnosis not present

## 2015-09-18 DIAGNOSIS — F3132 Bipolar disorder, current episode depressed, moderate: Secondary | ICD-10-CM | POA: Diagnosis not present

## 2015-09-18 DIAGNOSIS — F411 Generalized anxiety disorder: Secondary | ICD-10-CM | POA: Diagnosis not present

## 2015-10-09 DIAGNOSIS — F411 Generalized anxiety disorder: Secondary | ICD-10-CM | POA: Diagnosis not present

## 2015-11-06 DIAGNOSIS — F3132 Bipolar disorder, current episode depressed, moderate: Secondary | ICD-10-CM | POA: Diagnosis not present

## 2015-11-06 DIAGNOSIS — F909 Attention-deficit hyperactivity disorder, unspecified type: Secondary | ICD-10-CM | POA: Diagnosis not present

## 2015-11-06 DIAGNOSIS — F411 Generalized anxiety disorder: Secondary | ICD-10-CM | POA: Diagnosis not present

## 2015-11-14 DIAGNOSIS — F909 Attention-deficit hyperactivity disorder, unspecified type: Secondary | ICD-10-CM | POA: Diagnosis not present

## 2015-11-14 DIAGNOSIS — F411 Generalized anxiety disorder: Secondary | ICD-10-CM | POA: Diagnosis not present

## 2015-11-14 DIAGNOSIS — F3132 Bipolar disorder, current episode depressed, moderate: Secondary | ICD-10-CM | POA: Diagnosis not present

## 2015-11-15 DIAGNOSIS — F4310 Post-traumatic stress disorder, unspecified: Secondary | ICD-10-CM | POA: Diagnosis not present

## 2015-11-15 DIAGNOSIS — F909 Attention-deficit hyperactivity disorder, unspecified type: Secondary | ICD-10-CM | POA: Diagnosis not present

## 2015-11-15 DIAGNOSIS — F3132 Bipolar disorder, current episode depressed, moderate: Secondary | ICD-10-CM | POA: Diagnosis not present

## 2015-11-15 DIAGNOSIS — F411 Generalized anxiety disorder: Secondary | ICD-10-CM | POA: Diagnosis not present

## 2015-11-28 DIAGNOSIS — F411 Generalized anxiety disorder: Secondary | ICD-10-CM | POA: Diagnosis not present

## 2015-11-28 DIAGNOSIS — F3132 Bipolar disorder, current episode depressed, moderate: Secondary | ICD-10-CM | POA: Diagnosis not present

## 2015-11-28 DIAGNOSIS — F909 Attention-deficit hyperactivity disorder, unspecified type: Secondary | ICD-10-CM | POA: Diagnosis not present

## 2015-12-04 DIAGNOSIS — F909 Attention-deficit hyperactivity disorder, unspecified type: Secondary | ICD-10-CM | POA: Diagnosis not present

## 2015-12-04 DIAGNOSIS — F3132 Bipolar disorder, current episode depressed, moderate: Secondary | ICD-10-CM | POA: Diagnosis not present

## 2015-12-04 DIAGNOSIS — G8929 Other chronic pain: Secondary | ICD-10-CM | POA: Diagnosis not present

## 2015-12-04 DIAGNOSIS — R531 Weakness: Secondary | ICD-10-CM | POA: Diagnosis not present

## 2015-12-04 DIAGNOSIS — R7989 Other specified abnormal findings of blood chemistry: Secondary | ICD-10-CM | POA: Diagnosis not present

## 2015-12-04 DIAGNOSIS — S299XXA Unspecified injury of thorax, initial encounter: Secondary | ICD-10-CM | POA: Diagnosis not present

## 2015-12-04 DIAGNOSIS — R4182 Altered mental status, unspecified: Secondary | ICD-10-CM | POA: Diagnosis not present

## 2015-12-04 DIAGNOSIS — Z79899 Other long term (current) drug therapy: Secondary | ICD-10-CM | POA: Diagnosis not present

## 2015-12-04 DIAGNOSIS — F329 Major depressive disorder, single episode, unspecified: Secondary | ICD-10-CM | POA: Diagnosis not present

## 2015-12-04 DIAGNOSIS — R5383 Other fatigue: Secondary | ICD-10-CM | POA: Diagnosis not present

## 2015-12-04 DIAGNOSIS — M549 Dorsalgia, unspecified: Secondary | ICD-10-CM | POA: Diagnosis not present

## 2015-12-04 DIAGNOSIS — R404 Transient alteration of awareness: Secondary | ICD-10-CM | POA: Diagnosis not present

## 2015-12-04 DIAGNOSIS — F901 Attention-deficit hyperactivity disorder, predominantly hyperactive type: Secondary | ICD-10-CM | POA: Diagnosis not present

## 2015-12-04 DIAGNOSIS — R739 Hyperglycemia, unspecified: Secondary | ICD-10-CM | POA: Diagnosis not present

## 2015-12-04 DIAGNOSIS — F411 Generalized anxiety disorder: Secondary | ICD-10-CM | POA: Diagnosis not present

## 2015-12-05 DIAGNOSIS — F3164 Bipolar disorder, current episode mixed, severe, with psychotic features: Secondary | ICD-10-CM | POA: Diagnosis not present

## 2015-12-05 DIAGNOSIS — Z6832 Body mass index (BMI) 32.0-32.9, adult: Secondary | ICD-10-CM | POA: Diagnosis not present

## 2015-12-05 DIAGNOSIS — F909 Attention-deficit hyperactivity disorder, unspecified type: Secondary | ICD-10-CM | POA: Diagnosis not present

## 2015-12-05 DIAGNOSIS — R7989 Other specified abnormal findings of blood chemistry: Secondary | ICD-10-CM | POA: Diagnosis not present

## 2015-12-05 DIAGNOSIS — R400 Somnolence: Secondary | ICD-10-CM | POA: Diagnosis not present

## 2015-12-05 DIAGNOSIS — F329 Major depressive disorder, single episode, unspecified: Secondary | ICD-10-CM | POA: Diagnosis not present

## 2015-12-06 DIAGNOSIS — Z6829 Body mass index (BMI) 29.0-29.9, adult: Secondary | ICD-10-CM | POA: Diagnosis not present

## 2015-12-06 DIAGNOSIS — R443 Hallucinations, unspecified: Secondary | ICD-10-CM | POA: Diagnosis not present

## 2015-12-06 DIAGNOSIS — B373 Candidiasis of vulva and vagina: Secondary | ICD-10-CM | POA: Diagnosis not present

## 2015-12-06 DIAGNOSIS — Z01419 Encounter for gynecological examination (general) (routine) without abnormal findings: Secondary | ICD-10-CM | POA: Diagnosis not present

## 2015-12-06 DIAGNOSIS — R4789 Other speech disturbances: Secondary | ICD-10-CM | POA: Diagnosis not present

## 2015-12-11 DIAGNOSIS — S39012A Strain of muscle, fascia and tendon of lower back, initial encounter: Secondary | ICD-10-CM | POA: Diagnosis not present

## 2015-12-11 DIAGNOSIS — M62830 Muscle spasm of back: Secondary | ICD-10-CM | POA: Diagnosis not present

## 2015-12-14 DIAGNOSIS — F3132 Bipolar disorder, current episode depressed, moderate: Secondary | ICD-10-CM | POA: Diagnosis not present

## 2015-12-14 DIAGNOSIS — F909 Attention-deficit hyperactivity disorder, unspecified type: Secondary | ICD-10-CM | POA: Diagnosis not present

## 2015-12-14 DIAGNOSIS — F411 Generalized anxiety disorder: Secondary | ICD-10-CM | POA: Diagnosis not present

## 2015-12-17 DIAGNOSIS — F3132 Bipolar disorder, current episode depressed, moderate: Secondary | ICD-10-CM | POA: Diagnosis not present

## 2015-12-17 DIAGNOSIS — F411 Generalized anxiety disorder: Secondary | ICD-10-CM | POA: Diagnosis not present

## 2015-12-17 DIAGNOSIS — F909 Attention-deficit hyperactivity disorder, unspecified type: Secondary | ICD-10-CM | POA: Diagnosis not present

## 2015-12-25 DIAGNOSIS — F3132 Bipolar disorder, current episode depressed, moderate: Secondary | ICD-10-CM | POA: Diagnosis not present

## 2015-12-25 DIAGNOSIS — F4310 Post-traumatic stress disorder, unspecified: Secondary | ICD-10-CM | POA: Diagnosis not present

## 2015-12-25 DIAGNOSIS — F411 Generalized anxiety disorder: Secondary | ICD-10-CM | POA: Diagnosis not present

## 2015-12-27 DIAGNOSIS — F3132 Bipolar disorder, current episode depressed, moderate: Secondary | ICD-10-CM | POA: Diagnosis not present

## 2015-12-27 DIAGNOSIS — F909 Attention-deficit hyperactivity disorder, unspecified type: Secondary | ICD-10-CM | POA: Diagnosis not present

## 2015-12-27 DIAGNOSIS — F411 Generalized anxiety disorder: Secondary | ICD-10-CM | POA: Diagnosis not present

## 2016-01-02 DIAGNOSIS — F3132 Bipolar disorder, current episode depressed, moderate: Secondary | ICD-10-CM | POA: Diagnosis not present

## 2016-01-02 DIAGNOSIS — F4310 Post-traumatic stress disorder, unspecified: Secondary | ICD-10-CM | POA: Diagnosis not present

## 2016-01-14 DIAGNOSIS — F4310 Post-traumatic stress disorder, unspecified: Secondary | ICD-10-CM | POA: Diagnosis not present

## 2016-01-14 DIAGNOSIS — F411 Generalized anxiety disorder: Secondary | ICD-10-CM | POA: Diagnosis not present

## 2016-01-25 DIAGNOSIS — F411 Generalized anxiety disorder: Secondary | ICD-10-CM | POA: Diagnosis not present

## 2016-01-25 DIAGNOSIS — F3132 Bipolar disorder, current episode depressed, moderate: Secondary | ICD-10-CM | POA: Diagnosis not present

## 2016-01-25 DIAGNOSIS — F909 Attention-deficit hyperactivity disorder, unspecified type: Secondary | ICD-10-CM | POA: Diagnosis not present

## 2016-01-28 DIAGNOSIS — F900 Attention-deficit hyperactivity disorder, predominantly inattentive type: Secondary | ICD-10-CM | POA: Diagnosis not present

## 2016-02-12 ENCOUNTER — Encounter (HOSPITAL_COMMUNITY): Payer: Self-pay

## 2016-02-22 DIAGNOSIS — F900 Attention-deficit hyperactivity disorder, predominantly inattentive type: Secondary | ICD-10-CM | POA: Diagnosis not present

## 2016-03-06 DIAGNOSIS — F900 Attention-deficit hyperactivity disorder, predominantly inattentive type: Secondary | ICD-10-CM | POA: Diagnosis not present

## 2016-03-20 DIAGNOSIS — F900 Attention-deficit hyperactivity disorder, predominantly inattentive type: Secondary | ICD-10-CM | POA: Diagnosis not present

## 2016-03-25 DIAGNOSIS — F900 Attention-deficit hyperactivity disorder, predominantly inattentive type: Secondary | ICD-10-CM | POA: Diagnosis not present

## 2016-03-26 DIAGNOSIS — F900 Attention-deficit hyperactivity disorder, predominantly inattentive type: Secondary | ICD-10-CM | POA: Diagnosis not present

## 2016-04-02 DIAGNOSIS — F900 Attention-deficit hyperactivity disorder, predominantly inattentive type: Secondary | ICD-10-CM | POA: Diagnosis not present

## 2016-04-07 DIAGNOSIS — F900 Attention-deficit hyperactivity disorder, predominantly inattentive type: Secondary | ICD-10-CM | POA: Diagnosis not present

## 2016-04-21 DIAGNOSIS — F900 Attention-deficit hyperactivity disorder, predominantly inattentive type: Secondary | ICD-10-CM | POA: Diagnosis not present

## 2016-04-22 DIAGNOSIS — F900 Attention-deficit hyperactivity disorder, predominantly inattentive type: Secondary | ICD-10-CM | POA: Diagnosis not present

## 2016-05-05 DIAGNOSIS — F900 Attention-deficit hyperactivity disorder, predominantly inattentive type: Secondary | ICD-10-CM | POA: Diagnosis not present

## 2016-05-15 DIAGNOSIS — R8299 Other abnormal findings in urine: Secondary | ICD-10-CM | POA: Diagnosis not present

## 2016-05-15 DIAGNOSIS — N390 Urinary tract infection, site not specified: Secondary | ICD-10-CM | POA: Diagnosis not present

## 2016-05-15 DIAGNOSIS — Z0000 Encounter for general adult medical examination without abnormal findings: Secondary | ICD-10-CM | POA: Diagnosis not present

## 2016-05-15 DIAGNOSIS — E538 Deficiency of other specified B group vitamins: Secondary | ICD-10-CM | POA: Diagnosis not present

## 2016-05-15 DIAGNOSIS — F900 Attention-deficit hyperactivity disorder, predominantly inattentive type: Secondary | ICD-10-CM | POA: Diagnosis not present

## 2016-05-15 DIAGNOSIS — I10 Essential (primary) hypertension: Secondary | ICD-10-CM | POA: Diagnosis not present

## 2016-05-26 DIAGNOSIS — F900 Attention-deficit hyperactivity disorder, predominantly inattentive type: Secondary | ICD-10-CM | POA: Diagnosis not present

## 2016-06-05 DIAGNOSIS — Z6830 Body mass index (BMI) 30.0-30.9, adult: Secondary | ICD-10-CM | POA: Diagnosis not present

## 2016-06-05 DIAGNOSIS — D72819 Decreased white blood cell count, unspecified: Secondary | ICD-10-CM | POA: Diagnosis not present

## 2016-06-05 DIAGNOSIS — Z0000 Encounter for general adult medical examination without abnormal findings: Secondary | ICD-10-CM | POA: Diagnosis not present

## 2016-06-05 DIAGNOSIS — D649 Anemia, unspecified: Secondary | ICD-10-CM | POA: Diagnosis not present

## 2016-06-05 DIAGNOSIS — E784 Other hyperlipidemia: Secondary | ICD-10-CM | POA: Diagnosis not present

## 2016-06-05 DIAGNOSIS — F3189 Other bipolar disorder: Secondary | ICD-10-CM | POA: Diagnosis not present

## 2016-06-05 DIAGNOSIS — E538 Deficiency of other specified B group vitamins: Secondary | ICD-10-CM | POA: Diagnosis not present

## 2016-06-12 DIAGNOSIS — F900 Attention-deficit hyperactivity disorder, predominantly inattentive type: Secondary | ICD-10-CM | POA: Diagnosis not present

## 2016-06-24 DIAGNOSIS — F900 Attention-deficit hyperactivity disorder, predominantly inattentive type: Secondary | ICD-10-CM | POA: Diagnosis not present

## 2016-07-07 DIAGNOSIS — F900 Attention-deficit hyperactivity disorder, predominantly inattentive type: Secondary | ICD-10-CM | POA: Diagnosis not present

## 2016-08-01 DIAGNOSIS — F900 Attention-deficit hyperactivity disorder, predominantly inattentive type: Secondary | ICD-10-CM | POA: Diagnosis not present

## 2016-08-15 DIAGNOSIS — F900 Attention-deficit hyperactivity disorder, predominantly inattentive type: Secondary | ICD-10-CM | POA: Diagnosis not present

## 2016-08-29 DIAGNOSIS — F900 Attention-deficit hyperactivity disorder, predominantly inattentive type: Secondary | ICD-10-CM | POA: Diagnosis not present

## 2016-09-22 DIAGNOSIS — F900 Attention-deficit hyperactivity disorder, predominantly inattentive type: Secondary | ICD-10-CM | POA: Diagnosis not present

## 2016-09-30 DIAGNOSIS — B373 Candidiasis of vulva and vagina: Secondary | ICD-10-CM | POA: Diagnosis not present

## 2016-09-30 DIAGNOSIS — N760 Acute vaginitis: Secondary | ICD-10-CM | POA: Diagnosis not present

## 2016-10-01 DIAGNOSIS — F900 Attention-deficit hyperactivity disorder, predominantly inattentive type: Secondary | ICD-10-CM | POA: Diagnosis not present

## 2016-10-13 DIAGNOSIS — F900 Attention-deficit hyperactivity disorder, predominantly inattentive type: Secondary | ICD-10-CM | POA: Diagnosis not present

## 2016-11-04 DIAGNOSIS — F900 Attention-deficit hyperactivity disorder, predominantly inattentive type: Secondary | ICD-10-CM | POA: Diagnosis not present

## 2016-11-14 DIAGNOSIS — F900 Attention-deficit hyperactivity disorder, predominantly inattentive type: Secondary | ICD-10-CM | POA: Diagnosis not present

## 2016-12-08 DIAGNOSIS — Z6831 Body mass index (BMI) 31.0-31.9, adult: Secondary | ICD-10-CM | POA: Diagnosis not present

## 2016-12-08 DIAGNOSIS — Z01419 Encounter for gynecological examination (general) (routine) without abnormal findings: Secondary | ICD-10-CM | POA: Diagnosis not present

## 2016-12-24 DIAGNOSIS — F900 Attention-deficit hyperactivity disorder, predominantly inattentive type: Secondary | ICD-10-CM | POA: Diagnosis not present

## 2017-01-01 DIAGNOSIS — F900 Attention-deficit hyperactivity disorder, predominantly inattentive type: Secondary | ICD-10-CM | POA: Diagnosis not present

## 2017-01-08 DIAGNOSIS — F900 Attention-deficit hyperactivity disorder, predominantly inattentive type: Secondary | ICD-10-CM | POA: Diagnosis not present

## 2017-02-11 ENCOUNTER — Encounter (HOSPITAL_COMMUNITY): Payer: Self-pay

## 2017-03-02 DIAGNOSIS — F900 Attention-deficit hyperactivity disorder, predominantly inattentive type: Secondary | ICD-10-CM | POA: Diagnosis not present

## 2017-03-23 DIAGNOSIS — F900 Attention-deficit hyperactivity disorder, predominantly inattentive type: Secondary | ICD-10-CM | POA: Diagnosis not present

## 2017-03-26 DIAGNOSIS — F900 Attention-deficit hyperactivity disorder, predominantly inattentive type: Secondary | ICD-10-CM | POA: Diagnosis not present

## 2017-04-20 DIAGNOSIS — F900 Attention-deficit hyperactivity disorder, predominantly inattentive type: Secondary | ICD-10-CM | POA: Diagnosis not present

## 2017-05-18 DIAGNOSIS — F900 Attention-deficit hyperactivity disorder, predominantly inattentive type: Secondary | ICD-10-CM | POA: Diagnosis not present

## 2017-06-01 DIAGNOSIS — R82998 Other abnormal findings in urine: Secondary | ICD-10-CM | POA: Diagnosis not present

## 2017-06-01 DIAGNOSIS — Z0000 Encounter for general adult medical examination without abnormal findings: Secondary | ICD-10-CM | POA: Diagnosis not present

## 2017-06-01 DIAGNOSIS — I10 Essential (primary) hypertension: Secondary | ICD-10-CM | POA: Diagnosis not present

## 2017-06-01 DIAGNOSIS — E538 Deficiency of other specified B group vitamins: Secondary | ICD-10-CM | POA: Diagnosis not present

## 2017-06-02 DIAGNOSIS — D649 Anemia, unspecified: Secondary | ICD-10-CM | POA: Diagnosis not present

## 2017-06-08 DIAGNOSIS — Z6833 Body mass index (BMI) 33.0-33.9, adult: Secondary | ICD-10-CM | POA: Diagnosis not present

## 2017-06-08 DIAGNOSIS — Z1389 Encounter for screening for other disorder: Secondary | ICD-10-CM | POA: Diagnosis not present

## 2017-06-08 DIAGNOSIS — D649 Anemia, unspecified: Secondary | ICD-10-CM | POA: Diagnosis not present

## 2017-06-08 DIAGNOSIS — E7849 Other hyperlipidemia: Secondary | ICD-10-CM | POA: Diagnosis not present

## 2017-06-08 DIAGNOSIS — D72819 Decreased white blood cell count, unspecified: Secondary | ICD-10-CM | POA: Diagnosis not present

## 2017-06-08 DIAGNOSIS — I10 Essential (primary) hypertension: Secondary | ICD-10-CM | POA: Diagnosis not present

## 2017-06-08 DIAGNOSIS — Z0000 Encounter for general adult medical examination without abnormal findings: Secondary | ICD-10-CM | POA: Diagnosis not present

## 2017-06-19 DIAGNOSIS — F900 Attention-deficit hyperactivity disorder, predominantly inattentive type: Secondary | ICD-10-CM | POA: Diagnosis not present

## 2017-07-06 DIAGNOSIS — Z6833 Body mass index (BMI) 33.0-33.9, adult: Secondary | ICD-10-CM | POA: Diagnosis not present

## 2017-07-06 DIAGNOSIS — D6489 Other specified anemias: Secondary | ICD-10-CM | POA: Diagnosis not present

## 2017-07-06 DIAGNOSIS — I10 Essential (primary) hypertension: Secondary | ICD-10-CM | POA: Diagnosis not present

## 2017-07-06 DIAGNOSIS — E668 Other obesity: Secondary | ICD-10-CM | POA: Diagnosis not present

## 2017-08-06 DIAGNOSIS — Z6833 Body mass index (BMI) 33.0-33.9, adult: Secondary | ICD-10-CM | POA: Diagnosis not present

## 2017-08-06 DIAGNOSIS — E668 Other obesity: Secondary | ICD-10-CM | POA: Diagnosis not present

## 2017-08-11 DIAGNOSIS — F900 Attention-deficit hyperactivity disorder, predominantly inattentive type: Secondary | ICD-10-CM | POA: Diagnosis not present

## 2017-09-10 DIAGNOSIS — F900 Attention-deficit hyperactivity disorder, predominantly inattentive type: Secondary | ICD-10-CM | POA: Diagnosis not present

## 2017-09-15 DIAGNOSIS — F900 Attention-deficit hyperactivity disorder, predominantly inattentive type: Secondary | ICD-10-CM | POA: Diagnosis not present

## 2017-10-13 DIAGNOSIS — F900 Attention-deficit hyperactivity disorder, predominantly inattentive type: Secondary | ICD-10-CM | POA: Diagnosis not present

## 2017-11-10 DIAGNOSIS — F900 Attention-deficit hyperactivity disorder, predominantly inattentive type: Secondary | ICD-10-CM | POA: Diagnosis not present

## 2017-11-16 DIAGNOSIS — S93602A Unspecified sprain of left foot, initial encounter: Secondary | ICD-10-CM | POA: Diagnosis not present

## 2017-12-03 ENCOUNTER — Ambulatory Visit: Payer: Self-pay | Admitting: Mental Health

## 2017-12-10 DIAGNOSIS — D72819 Decreased white blood cell count, unspecified: Secondary | ICD-10-CM | POA: Diagnosis not present

## 2017-12-10 DIAGNOSIS — I10 Essential (primary) hypertension: Secondary | ICD-10-CM | POA: Diagnosis not present

## 2017-12-10 DIAGNOSIS — D6489 Other specified anemias: Secondary | ICD-10-CM | POA: Diagnosis not present

## 2017-12-10 DIAGNOSIS — D649 Anemia, unspecified: Secondary | ICD-10-CM | POA: Diagnosis not present

## 2017-12-10 DIAGNOSIS — E538 Deficiency of other specified B group vitamins: Secondary | ICD-10-CM | POA: Diagnosis not present

## 2017-12-10 DIAGNOSIS — F3189 Other bipolar disorder: Secondary | ICD-10-CM | POA: Diagnosis not present

## 2017-12-15 DIAGNOSIS — Z6834 Body mass index (BMI) 34.0-34.9, adult: Secondary | ICD-10-CM | POA: Diagnosis not present

## 2017-12-15 DIAGNOSIS — F319 Bipolar disorder, unspecified: Secondary | ICD-10-CM | POA: Diagnosis not present

## 2017-12-15 DIAGNOSIS — E668 Other obesity: Secondary | ICD-10-CM | POA: Diagnosis not present

## 2017-12-16 ENCOUNTER — Ambulatory Visit: Payer: BLUE CROSS/BLUE SHIELD | Admitting: Mental Health

## 2017-12-21 ENCOUNTER — Ambulatory Visit: Payer: BLUE CROSS/BLUE SHIELD | Admitting: Mental Health

## 2018-01-04 DIAGNOSIS — Z01419 Encounter for gynecological examination (general) (routine) without abnormal findings: Secondary | ICD-10-CM | POA: Diagnosis not present

## 2018-01-04 DIAGNOSIS — Z1151 Encounter for screening for human papillomavirus (HPV): Secondary | ICD-10-CM | POA: Diagnosis not present

## 2018-01-04 DIAGNOSIS — Z6833 Body mass index (BMI) 33.0-33.9, adult: Secondary | ICD-10-CM | POA: Diagnosis not present

## 2018-01-11 ENCOUNTER — Ambulatory Visit: Payer: BLUE CROSS/BLUE SHIELD | Admitting: Mental Health

## 2018-01-11 DIAGNOSIS — F900 Attention-deficit hyperactivity disorder, predominantly inattentive type: Secondary | ICD-10-CM | POA: Diagnosis not present

## 2018-01-11 NOTE — Progress Notes (Signed)
      [facility] Counselor/Therapist Progress Note   Patient ID: [name], MRN: [id number]  Date: 01/11/2018  Timespent: 45 minutes   Treatment Type: Individual   Reported Symptoms: irritated, frustrated,edgy   Mental Status Exam:    Appearance:   Casual     Behavior:  tense  Motor:  Normal  Speech/Language:   Pressured  Affect:  frustrated, dsappointed  Mood:  agitated  Thought process:  normal  Thought content:    WNL  Sensory/Perceptual disturbances:    WNL  Orientation:  oriented to person, place and time/date  Attention:  Good  Concentration:  Good  Memory:  WNL  Fund of knowledge:   Good  Insight:    Good  Judgment:   Good  Impulse Control:  Good     Risk Assessment: Danger to Self:  No Self-injurious Behavior: No Danger to Others: No Duty to Warn:no Physical Aggression / Violence:No  Access to Firearms a concern: No  Gang Involvement:No    Subjective:  Very frustrated with difficulties in marriage. Husband is sloppy and unkepmt.        Interventions: Cognitive Behavioral Therapy, Solution-Oriented/Positive Psychology, Interpersonal and supportive   Diagnosis:   ICD-10-CM   1. ADHD, predominantly inattentive type F90.0      Plan: Mood stability           Increase marita satisfaction           Assertiveness training           Work/life balance           Validation ad support   Ulice Boldarson Faysal Fenoglio, LPC

## 2018-01-12 ENCOUNTER — Encounter: Payer: Self-pay | Admitting: Emergency Medicine

## 2018-01-12 DIAGNOSIS — F900 Attention-deficit hyperactivity disorder, predominantly inattentive type: Secondary | ICD-10-CM | POA: Insufficient documentation

## 2018-02-25 ENCOUNTER — Encounter: Payer: Self-pay | Admitting: Physician Assistant

## 2018-02-25 ENCOUNTER — Ambulatory Visit: Payer: BLUE CROSS/BLUE SHIELD | Admitting: Physician Assistant

## 2018-02-25 DIAGNOSIS — F4310 Post-traumatic stress disorder, unspecified: Secondary | ICD-10-CM | POA: Diagnosis not present

## 2018-02-25 DIAGNOSIS — F331 Major depressive disorder, recurrent, moderate: Secondary | ICD-10-CM | POA: Diagnosis not present

## 2018-02-25 DIAGNOSIS — F902 Attention-deficit hyperactivity disorder, combined type: Secondary | ICD-10-CM

## 2018-02-25 MED ORDER — AMPHETAMINE-DEXTROAMPHETAMINE 30 MG PO TABS: 30.0000 mg | ORAL_TABLET | Freq: Two times a day (BID) | ORAL | 0 refills | Status: DC

## 2018-02-25 MED ORDER — QUETIAPINE FUMARATE 100 MG PO TABS: 100.0000 mg | ORAL_TABLET | Freq: Every day | ORAL | 1 refills | Status: DC

## 2018-02-25 NOTE — Progress Notes (Signed)
[facility] Med Check  Patient ID: [name],  MRN: [id number]  PCP: Creola Corn, MD  Date of Evaluation: 02/25/2018 Time spent:15 minutes  Chief Complaint:  Chief Complaint    Follow-up      HISTORY/CURRENT STATUS: HPI Here for 6 month med check. Things are going pretty well.  Work is fine.  She sleeps good most of the time.   Patient denies loss of interest in usual activities and is able to enjoy things.  Denies decreased energy or motivation.  Appetite has not changed.  No extreme sadness, tearfulness, or feelings of hopelessness.  Denies any changes in concentration, making decisions or remembering things.  Denies suicidal or homicidal thoughts.  States that attention is good without easy distractibility.  Able to focus on things and finish tasks to completion.   Individual Medical History/ Review of Systems: Changes? :No    Past medications for mental health diagnoses include: Adderall, Lamictal, Seroquel, Cymbalta, Tegretol, Buspar, Lexapro, Zyprexa   Allergies: Zyprexa [olanzapine]  Current Medications:  Current Outpatient Medications:  .  amphetamine-dextroamphetamine (ADDERALL) 30 MG tablet, Take 30 mg by mouth 2 (two) times daily., Disp: , Rfl:  .  escitalopram (LEXAPRO) 20 MG tablet, TAKE 1 TABLET BY MOUTH EVERY DAY, Disp: 90 tablet, Rfl: 0 .  etonogestrel-ethinyl estradiol (NUVARING) 0.12-0.015 MG/24HR vaginal ring, Place 1 each vaginally every 28 (twenty-eight) days. Insert vaginally and leave in place for 3 consecutive weeks, then remove for 1 week. , Disp: , Rfl:  .  ibuprofen (ADVIL,MOTRIN) 800 MG tablet, , Disp: , Rfl:  .  lamoTRIgine (LAMICTAL) 100 MG tablet, Take 100 mg by mouth 2 (two) times daily., Disp: , Rfl:  .  Liraglutide -Weight Management (SAXENDA) 18 MG/3ML SOPN, Inject into the skin daily., Disp: , Rfl:  .  QUEtiapine (SEROQUEL) 100 MG tablet, TAKE 1 TABLET AT BEDTIME, Disp: 90 tablet, Rfl: 0 Medication Side Effects: none  Family  Medical/ Social History: Changes? Yes son is in Middle college now. Her grandfather died in February 01, 2023.   MENTAL HEALTH EXAM:  There were no vitals taken for this visit.There is no height or weight on file to calculate BMI.  General Appearance: Casual, Well Groomed and Obese  Eye Contact:  Good  Speech:  Clear and Coherent  Volume:  Normal  Mood:  Euthymic  Affect:  Appropriate  Thought Process:  Goal Directed  Orientation:  Full (Time, Place, and Person)  Thought Content: Logical   Suicidal Thoughts:  No  Homicidal Thoughts:  No  Memory:  WNL  Judgement:  Good  Insight:  Good  Psychomotor Activity:  Normal  Concentration:  Concentration: Good  Recall:  Good  Fund of Knowledge: Good  Language: Good  Assets:  Desire for Improvement  ADL's:  Intact  Cognition: WNL  Prognosis:  Good    DIAGNOSES:    ICD-10-CM   1. Major depressive disorder, recurrent episode, moderate (HCC) F33.1   2. Attention-deficit disorder F98.8   3. PTSD (post-traumatic stress disorder) F43.10     Receiving Psychotherapy: Yes with [name], LPC   RECOMMENDATIONS: Continue all current meds.  Cont psychotherapy with [name], LPC. Return in 6 months.    Melony Overly, PA-C

## 2018-03-01 ENCOUNTER — Ambulatory Visit: Payer: BLUE CROSS/BLUE SHIELD | Admitting: Mental Health

## 2018-03-01 ENCOUNTER — Encounter: Payer: Self-pay | Admitting: Mental Health

## 2018-03-01 DIAGNOSIS — F900 Attention-deficit hyperactivity disorder, predominantly inattentive type: Secondary | ICD-10-CM

## 2018-03-01 NOTE — Progress Notes (Signed)
      [facility] Counselor/Therapist Progress Note  Patient ID: [name], MRN: [id number],    Date: 03/01/2018  Time Spent: 45 minutes  Treatment Type: Individual Therapy  Reported Symptoms: Anxious Mood and Fatigue, stressed.  Mental Status Exam:  Appearance:   Casual     Behavior:  Appropriate  Motor:  Normal  Speech/Language:   Normal Rate  Affect:  Appropriate  Mood:  anxious  Thought process:  normal  Thought content:    WNL  Sensory/Perceptual disturbances:    WNL  Orientation:  oriented to person, place and time/date  Attention:  Good  Concentration:  Good  Memory:  WNL  Fund of knowledge:   Good  Insight:    Good  Judgment:   Good  Impulse Control:  Good   Risk Assessment: Danger to Self:  No Self-injurious Behavior: No Danger to Others: No Duty to Warn:no Physical Aggression / Violence:No  Access to Firearms a concern: No  Gang Involvement:No   Subjective:  Father's house caught on fire and he is in hospital. Also grandfather passed away. Has had a lot of trauma in recent weeks. Son doing well in school and now in drivers ed.  Interventions: Cognitive Behavioral Therapy, Solution-Oriented/Positive Psychology, Insight-Oriented, Interpersonal and supportive  Diagnosis:   ICD-10-CM   1. ADHD, predominantly inattentive type F90.0     Plan:    Mood stability              Assertiveness              Self care program              Work/life balance  [name], LPC

## 2018-03-22 ENCOUNTER — Encounter: Payer: Self-pay | Admitting: Mental Health

## 2018-03-22 ENCOUNTER — Ambulatory Visit: Payer: BLUE CROSS/BLUE SHIELD | Admitting: Mental Health

## 2018-03-22 ENCOUNTER — Other Ambulatory Visit: Payer: Self-pay | Admitting: Physician Assistant

## 2018-03-22 DIAGNOSIS — F900 Attention-deficit hyperactivity disorder, predominantly inattentive type: Secondary | ICD-10-CM | POA: Diagnosis not present

## 2018-03-22 DIAGNOSIS — F3131 Bipolar disorder, current episode depressed, mild: Secondary | ICD-10-CM

## 2018-03-22 NOTE — Progress Notes (Signed)
      [facility] Counselor/Therapist Progress Note  Patient ID: [name], MRN: [id number],    Date: 03/22/2018  Time Spent: 45 minutes  Treatment Type: Individual Therapy  Reported Symptoms: Anxious Mood  Mental Status Exam:  Appearance:   Casual     Behavior:  Appropriate  Motor:  Normal  Speech/Language:   Normal Rate  Affect:  Appropriate and open and transparent  Mood:  anxious  Thought process:  normal  Thought content:    WNL  Sensory/Perceptual disturbances:    WNL  Orientation:  oriented to person, place and time/date  Attention:  Good  Concentration:  Good  Memory:  WNL  Fund of knowledge:   Good  Insight:    Good  Judgment:   Good  Impulse Control:  Good   Risk Assessment: Danger to Self:  No Self-injurious Behavior: No Danger to Others: No Duty to Warn:no Physical Aggression / Violence:No  Access to Firearms a concern: No  Gang Involvement:No   Subjective:  Very concerned about father who had a stroke. Fathere has to have heart and vascular surgery. Doctor suggested her father move in with [name] and that presents concerns for [name] and her family.  Interventions: Cognitive Behavioral Therapy, Solution-Oriented/Positive Psychology, Insight-Oriented, Family Systems and Interpersonal  Diagnosis:   ICD-10-CM   1. ADHD, predominantly inattentive type F90.0     Plan: Maintain mood stability           Assertive communication           Self care program           Work/life balance           Validation and support  [name], [facility]

## 2018-04-06 DIAGNOSIS — J029 Acute pharyngitis, unspecified: Secondary | ICD-10-CM | POA: Diagnosis not present

## 2018-04-06 DIAGNOSIS — J018 Other acute sinusitis: Secondary | ICD-10-CM | POA: Diagnosis not present

## 2018-04-06 DIAGNOSIS — R030 Elevated blood-pressure reading, without diagnosis of hypertension: Secondary | ICD-10-CM | POA: Diagnosis not present

## 2018-04-19 ENCOUNTER — Encounter: Payer: Self-pay | Admitting: Mental Health

## 2018-04-19 ENCOUNTER — Ambulatory Visit: Payer: BLUE CROSS/BLUE SHIELD | Admitting: Mental Health

## 2018-04-19 DIAGNOSIS — F900 Attention-deficit hyperactivity disorder, predominantly inattentive type: Secondary | ICD-10-CM

## 2018-04-19 NOTE — Progress Notes (Signed)
      [facility] Counselor/Therapist Progress Note  Patient ID: [name], MRN: [id number],    Date: 04/19/2018  Time Spent: 45 minuts  Treatment Type: Individual Therapy  Reported Symptoms: depressed, angry, anxious, irritable, frustrated, upset  Mental Status Exam:  Appearance:   Casual     Behavior:  Agitated  Motor:  Normal  Speech/Language:   Pressured  Affect:  Depressed and upset, angry  Mood:  angry, anxious, depressed and irritable  Thought process:  negative  Thought content:    Rumination  Sensory/Perceptual disturbances:    WNL  Orientation:  oriented to person, place and time/date  Attention:  Good  Concentration:  Good  Memory:  WNL  Fund of knowledge:   Good  Insight:    Good  Judgment:   Good  Impulse Control:  Good   Risk Assessment: Danger to Self:  No Self-injurious Behavior: No Danger to Others: No Duty to Warn:no Physical Aggression / Violence:No  Access to Firearms a concern: No  Gang Involvement:No   Subjective:   Upset and stunned that her husband walked out on her and their son. Feels aggravated and frustrated, angry.  Father has been in the hospital and he is facing surgery. Husband has abandoned them financially and father is helping. Very stressful situation. Sad, anxious and depressed.  Interventions: Cognitive Behavioral Therapy, Solution-Oriented/Positive Psychology, Insight-Oriented, Family Systems and Interpersonal  Diagnosis:   ICD-10-CM   1. Attention-deficit hyperactivity disorder, predominantly inattentive type F90.0     Plan: Stabilize mood           Assertive communication           Self care program           Work/life balance            Restore marital interaction         [place], [place]

## 2018-05-06 ENCOUNTER — Ambulatory Visit: Payer: BLUE CROSS/BLUE SHIELD | Admitting: Mental Health

## 2018-05-10 ENCOUNTER — Encounter: Payer: Self-pay | Admitting: Mental Health

## 2018-05-10 ENCOUNTER — Ambulatory Visit: Payer: BLUE CROSS/BLUE SHIELD | Admitting: Mental Health

## 2018-05-10 ENCOUNTER — Other Ambulatory Visit: Payer: Self-pay

## 2018-05-10 DIAGNOSIS — F4310 Post-traumatic stress disorder, unspecified: Secondary | ICD-10-CM | POA: Diagnosis not present

## 2018-05-10 NOTE — Progress Notes (Signed)
[facility] Counselor/Therapist Progress Note  Patient ID: [name], MRN: [id number],    Date: 05/10/2018  Time Spent:45 minutes  Treatment Type: Individual Therapy  Reported Symptoms:   Mental Status Exam:  Appearance:   Casual     Behavior:  Appropriate and Sharing  Motor:  Normal  Speech/Language:   Normal Rate, pressured  Affect:  Negative  Mood:  anxious, depressed and sad  Thought process:  goal directed  Thought content:    Rumination  Sensory/Perceptual disturbances:    WNL  Orientation:  oriented to person, place and time/date  Attention:  Good  Concentration:  Good  Memory:  WNL  Fund of knowledge:   Good  Insight:    Good  Judgment:   Good  Impulse Control:  Good   Risk Assessment: Danger to Self:  No Self-injurious Behavior: No Danger to Others: No Duty to Warn:no Physical Aggression / Violence:No  Access to Firearms a concern: No  Gang Involvement:No    Subjective: Husband has left her and is not coming back. He left her with the house, the dog and all the bills. She has been crying a lot. Tomorrow she sees an Pensions consultant. [name] has been staying with his parents who are enabling him.[name] has not given her any money.   Interventions: Cognitive Behavioral Therapy, Solution-Oriented/Positive Psychology, Insight-Oriented and Interpersonal  Diagnosis:   ICD-10-CM   1. PTSD (post-traumatic stress disorder) F43.10     Plan:   Treatment Plan   Patient nsme: [name]    Problems:     X      Anxiety:                   Locus of control                              Work/Life balance     X    Depression                             Problem-solving                      X        Relationships                                   Boundaries                                     Coping srategies                      X       Communication                    Recovery from trauma                   Self-care                                         Validation  Other     Goals:  Patient  1. Maintains mood stabiity:  decreased symptoms of  depression     anxiety  2.   Practices pro-active self-care:   restful sleep, nutrition, exercise  3.   Effective utilizes boundaries and sets limits  4.   Utliizes coping strategies and problem solving techniques for stress management  5.   Feels accurately heard, understood and validated  Other________________________________________________________________________________  _____________________________________________________________________________________     Discussed treatment plan with Patient.  Electronically signed.    [name]

## 2018-05-31 ENCOUNTER — Ambulatory Visit: Payer: BLUE CROSS/BLUE SHIELD | Admitting: Mental Health

## 2018-06-11 ENCOUNTER — Telehealth: Payer: Self-pay | Admitting: Physician Assistant

## 2018-06-11 ENCOUNTER — Other Ambulatory Visit: Payer: Self-pay

## 2018-06-11 NOTE — Telephone Encounter (Signed)
submitted

## 2018-06-11 NOTE — Telephone Encounter (Signed)
[name] called to request refills on her Adderall.  Next appt is 08/26/18.  Please [facility] to [facility]

## 2018-06-14 DIAGNOSIS — Z0000 Encounter for general adult medical examination without abnormal findings: Secondary | ICD-10-CM | POA: Diagnosis not present

## 2018-06-14 DIAGNOSIS — I10 Essential (primary) hypertension: Secondary | ICD-10-CM | POA: Diagnosis not present

## 2018-06-14 DIAGNOSIS — E538 Deficiency of other specified B group vitamins: Secondary | ICD-10-CM | POA: Diagnosis not present

## 2018-06-14 DIAGNOSIS — E7849 Other hyperlipidemia: Secondary | ICD-10-CM | POA: Diagnosis not present

## 2018-06-14 MED ORDER — AMPHETAMINE-DEXTROAMPHETAMINE 30 MG PO TABS: 30.0000 mg | ORAL_TABLET | Freq: Two times a day (BID) | ORAL | 0 refills | Status: DC

## 2018-06-21 DIAGNOSIS — Z0000 Encounter for general adult medical examination without abnormal findings: Secondary | ICD-10-CM | POA: Diagnosis not present

## 2018-07-06 ENCOUNTER — Ambulatory Visit (INDEPENDENT_AMBULATORY_CARE_PROVIDER_SITE_OTHER): Payer: BLUE CROSS/BLUE SHIELD | Admitting: Mental Health

## 2018-07-06 ENCOUNTER — Other Ambulatory Visit: Payer: Self-pay

## 2018-07-06 DIAGNOSIS — F900 Attention-deficit hyperactivity disorder, predominantly inattentive type: Secondary | ICD-10-CM

## 2018-07-06 DIAGNOSIS — F4323 Adjustment disorder with mixed anxiety and depressed mood: Secondary | ICD-10-CM | POA: Diagnosis not present

## 2018-07-06 NOTE — Progress Notes (Signed)
[facility] Counselor/Therapist Progress Note  Patient ID: [name], MRN: [id number],    Date: 07/06/2018   Telehealth visit I connected with patient by a video enabled telemedicine/telehealth application or telephone, with her informed consent, and verified patient privacy and that I am speaking with the correct person using two identifiers.  I was located at my home and patient at her home.  We discussed the limitations, risks, and security and privacy concerns associated with telehealth services and the availability of in-person appointments, including awareness that she may be responsible for charges related to the service, and she expressed understanding and agreed to proceed.  I discussed treatment planning with her, with opportunity to ask and answer all questions. Agreed with the plan, demonstrated an understanding of the instructions, and made her aware to call our office if symptoms worsen or she feels she is in a crisis state and needs immediate contact. Corona Virus Pandemic. 9:00 AM.  Time Spent: 45 minutes   Treatment Type: Individual Therapy  Reported Symptoms:   Mental Status Exam:  Appearance:   unseen     Behavior:  Appropriate and Sharing  Motor:  Restlestness  Speech/Language:   Clear and Coherent  Affect:  Full Range  Mood:  anxious, depressed and sad  Thought process:  goal directed  Thought content:    WNL  Sensory/Perceptual disturbances:    WNL  Orientation:  oriented to person, place and time/date  Attention:  Good  Concentration:  Good  Memory:  WNL  Fund of knowledge:   Good  Insight:    Good  Judgment:   Good  Impulse Control:  Good   Risk Assessment: Danger to Self:  No Self-injurious Behavior: No Danger to Others: No  Duty to Warn:no Physical Aggression / Violence:No  Access to Firearms a concern: No   Subjective: Going through divorce. Doing well.  Has lost about 30 lbs. Exercising. Feels proud of herself.. At first was  overwhelmed at the thought of being alone, but now likes it and feels happier.    Interventions: Solution-Oriented/Positive Psychology, Insight-Oriented, Interpersonal and supportive  Diagnosis:   ICD-10-CM   1. Attention deficit hyperactivity disorder (ADHD), predominantly inattentive type F90.0   2. Adjustment disorder with mixed anxiety and depressed mood F43.23       Treatment Plan   Patient Name: [name]   Date: Jul 06, 2018   Didactic topic to be discussed:           Anxiety:                   Locus of control                              Work/Life balance           Depression                             Problem-solving                              Relationships                                   Boundaries  Coping srategies                             Communication                    Recovery from trauma                    Self-care                                     Validation  Other     Goals:  Patient  1. Maintains mood stabiity:  decreased symptoms of     depression     anxiety  2.   Practices pro-active self-care:   restful sleep, nutrition, exercise, socialization  3.   Effective utilizes boundaries and sets limits  4.   Utliizes coping strategies and problem solving techniques for stress management  5.   Feels accurately heard, understood and validated  Other: Divorce      Tenny Craw [facility]     [name], [facility]

## 2018-08-07 ENCOUNTER — Other Ambulatory Visit: Payer: Self-pay | Admitting: Physician Assistant

## 2018-08-12 ENCOUNTER — Encounter: Payer: Self-pay | Admitting: Mental Health

## 2018-08-12 ENCOUNTER — Other Ambulatory Visit: Payer: Self-pay

## 2018-08-12 ENCOUNTER — Ambulatory Visit: Payer: BC Managed Care – PPO | Admitting: Mental Health

## 2018-08-12 DIAGNOSIS — F902 Attention-deficit hyperactivity disorder, combined type: Secondary | ICD-10-CM

## 2018-08-12 NOTE — Progress Notes (Signed)
[facility] Counselor/Therapist Progress Note  Patient ID: [name], MRN: [id number],    Date: 08/12/2018  Time Spent: 45 minutes  Treatment Type: Individual Therapy  Reported Symptoms: Stressed, anxiious. Reports depression improved. More adjusted tonew apartment and marital separation.  Mental Status Exam:  Appearance:   Casual     Behavior:  Appropriate  Motor:  Normal  Speech/Language:   Clear and Coherent  Affect:  Appropriate  Mood:  anxious and labile  Thought process:  normal  Thought content:    WNL  Sensory/Perceptual disturbances:    WNL  Orientation:  oriented to person, place and time/date  Attention:  Good  Concentration:  Good  Memory:  WNL  Fund of knowledge:   Good  Insight:    Good  Judgment:   Good  Impulse Control:  Good   Risk Assessment: Danger to Self:  No Self-injurious Behavior: No Danger to Others: No Duty to Warn:no Physical Aggression / Violence:No  Access to Firearms a concern: No  Gang Involvement:No   Subjective: In the middle of separation negotiations. Husband left and wanted her out of the house so [place] moved into her own apartment [place]. Working from home during [employer] Cor[employer]. Making adjustments effectively as needed. Has teenage son who is trying to make sense out of all that has happened. Has a trip to [place] coming up next week. Beginning to realize the benefits of being out of a negative marital relationship.  Interventions: Solution-Oriented/Positive Psychology, Insight-Oriented, Interpersonal and supportive  Diagnosis:   ICD-10-CM   1. Attention deficit hyperactivity disorder (ADHD), combined type  F90.2       Treatment Plan   Patient Name: [name]   Date:  August 12, 2018   Didactic topic to be discussed:           Anxiety:                   Locus of control                              Work/Life balance           Depression                             Problem-solving                               Relationships                                   Boundaries                                     Coping srategies                             Communication                    Recovery from trauma                    Self-care  Validation  Other     Goals:  Patient  1. Maintains mood stabiity:  decreased symptoms of     depression     anxiety  2.   Practices pro-active self-care:   restful sleep, nutrition, exercise, socialization  3.   Effective utilizes boundaries and sets limits  4.   Utliizes coping strategies and problem solving techniques for stress management  5.   Feels accurately heard, understood and validated  Other: Managing separation issues      [place], [facility]

## 2018-08-17 ENCOUNTER — Telehealth: Payer: Self-pay

## 2018-08-17 NOTE — Telephone Encounter (Signed)
Received a phone call yesterday from Pt's spouse Shanon Brow; listed on her contacts. They are currently separated per husband stating. He wanted to make provider aware of some behavior of pt; reported by their 39 year old son. He's noticed mood swings/changes, being very forgetful, erratic behavior such as pt saying she is going to run out quickly from home and is gone for hours. Spouse also wanted to make you aware she has hx of substance abuse approx 3 years ago and had to go to inpatient to be treated, wasn't sure you were aware. Pt has appt 08/26/2018.  Husband, Shanon Brow asking if provider would call back to discuss, informed him that I would make Helene Kelp aware (he's also a pt) but it would be her decision on how to proceed.

## 2018-08-18 NOTE — Telephone Encounter (Signed)
Under the circumstances, I think it's best for me to wait and talk to pt about all this.  I appreciate his call though and will keep this info in mind when I talk with her next week.

## 2018-08-26 ENCOUNTER — Ambulatory Visit: Payer: BLUE CROSS/BLUE SHIELD | Admitting: Physician Assistant

## 2018-09-02 ENCOUNTER — Other Ambulatory Visit: Payer: Self-pay | Admitting: Physician Assistant

## 2018-09-06 ENCOUNTER — Other Ambulatory Visit: Payer: Self-pay

## 2018-09-06 ENCOUNTER — Telehealth: Payer: Self-pay | Admitting: Physician Assistant

## 2018-09-06 MED ORDER — AMPHETAMINE-DEXTROAMPHETAMINE 30 MG PO TABS: 30.0000 mg | ORAL_TABLET | Freq: Two times a day (BID) | ORAL | 0 refills | Status: DC

## 2018-09-06 NOTE — Telephone Encounter (Signed)
Pended for approval.

## 2018-09-06 NOTE — Telephone Encounter (Signed)
Patient called and said that she needs a need refill of her adderrall 30 mg to be sent to the [facility] on [address] in [place]

## 2018-09-07 ENCOUNTER — Other Ambulatory Visit: Payer: Self-pay

## 2018-09-07 DIAGNOSIS — F3131 Bipolar disorder, current episode depressed, mild: Secondary | ICD-10-CM

## 2018-09-07 MED ORDER — ESCITALOPRAM OXALATE 20 MG PO TABS: 20.0000 mg | ORAL_TABLET | Freq: Every day | ORAL | 0 refills | Status: DC

## 2018-09-30 ENCOUNTER — Other Ambulatory Visit: Payer: Self-pay

## 2018-09-30 ENCOUNTER — Ambulatory Visit: Payer: BC Managed Care – PPO | Admitting: Physician Assistant

## 2018-09-30 ENCOUNTER — Telehealth: Payer: Self-pay | Admitting: Physician Assistant

## 2018-09-30 NOTE — Telephone Encounter (Signed)
Patient 's appt was cancelled today and she will need a refill on her adderall 30 mg 2 x daily [facility] [address] [facility] [place]. Next appt 9/16

## 2018-09-30 NOTE — Telephone Encounter (Signed)
Last fill 09/06/2018, will pend for approval

## 2018-10-01 ENCOUNTER — Other Ambulatory Visit: Payer: Self-pay

## 2018-10-01 MED ORDER — AMPHETAMINE-DEXTROAMPHETAMINE 30 MG PO TABS: 30.0000 mg | ORAL_TABLET | Freq: Two times a day (BID) | ORAL | 0 refills | Status: DC

## 2018-10-21 DIAGNOSIS — I781 Nevus, non-neoplastic: Secondary | ICD-10-CM | POA: Diagnosis not present

## 2018-10-28 ENCOUNTER — Telehealth: Payer: Self-pay | Admitting: Physician Assistant

## 2018-10-28 ENCOUNTER — Other Ambulatory Visit: Payer: Self-pay

## 2018-10-28 MED ORDER — AMPHETAMINE-DEXTROAMPHETAMINE 30 MG PO TABS: 30.0000 mg | ORAL_TABLET | Freq: Two times a day (BID) | ORAL | 0 refills | Status: DC

## 2018-10-28 NOTE — Telephone Encounter (Signed)
Patient would like script for Adderall 30 mg, [facility] [place] before [facility]

## 2018-10-28 NOTE — Telephone Encounter (Signed)
Last refill 10/03/2018 pended for approval

## 2018-11-09 ENCOUNTER — Ambulatory Visit (INDEPENDENT_AMBULATORY_CARE_PROVIDER_SITE_OTHER): Payer: BC Managed Care – PPO | Admitting: Physician Assistant

## 2018-11-09 ENCOUNTER — Other Ambulatory Visit: Payer: Self-pay

## 2018-11-09 ENCOUNTER — Encounter: Payer: Self-pay | Admitting: Physician Assistant

## 2018-11-09 DIAGNOSIS — Z630 Problems in relationship with spouse or partner: Secondary | ICD-10-CM

## 2018-11-09 DIAGNOSIS — F902 Attention-deficit hyperactivity disorder, combined type: Secondary | ICD-10-CM

## 2018-11-09 DIAGNOSIS — F4323 Adjustment disorder with mixed anxiety and depressed mood: Secondary | ICD-10-CM | POA: Diagnosis not present

## 2018-11-09 MED ORDER — AMPHETAMINE-DEXTROAMPHETAMINE 30 MG PO TABS: 30.0000 mg | ORAL_TABLET | Freq: Two times a day (BID) | ORAL | 0 refills | Status: DC

## 2018-11-09 NOTE — Progress Notes (Signed)
[facility] Med Check  Patient ID: [name],  MRN: [id number]  PCP: Shon Baton, MD  Date of Evaluation: 11/09/2018 Time spent:15 minutes  Chief Complaint:  Chief Complaint    Follow-up     Virtual Visit via Telephone Note  I connected with patient by a video enabled telemedicine application or telephone, with their informed consent, and verified patient privacy and that I am speaking with the correct person using two identifiers.  I am private, in my office and the patient is at home.  I discussed the limitations, risks, security and privacy concerns of performing an evaluation and management service by telephone and the availability of in person appointments. I also discussed with the patient that there may be a patient responsible charge related to this service. The patient expressed understanding and agreed to proceed.   I discussed the assessment and treatment plan with the patient. The patient was provided an opportunity to ask questions and all were answered. The patient agreed with the plan and demonstrated an understanding of the instructions.   The patient was advised to call back or seek an in-person evaluation if the symptoms worsen or if the condition fails to improve as anticipated.  I provided 15 minutes of non-face-to-face time during this encounter.  HISTORY/CURRENT STATUS: HPI For routine f/u.  Under a lot of stress in her marriage.  She and husband have split up about 6 months ago.  She's moved into an apartment.  Was really heartbroken at first but is now at peace about it.    Feels that meds are working well. States that attention is good without easy distractibility.  Able to focus on things and finish tasks to completion.   Patient denies loss of interest in usual activities and is able to enjoy things.  Denies decreased energy or motivation.  Appetite has not changed.  No extreme sadness, tearfulness, or feelings of hopelessness.  Denies any  changes in concentration, making decisions or remembering things.  Denies suicidal or homicidal thoughts.  Denies dizziness, syncope, seizures, numbness, tingling, tremor, tics, unsteady gait, slurred speech, confusion. Denies muscle or joint pain, stiffness, or dystonia.  Individual Medical History/ Review of Systems: Changes? :No    Past medications for mental health diagnoses include: Adderall, Lamictal, Seroquel, Cymbalta, Tegretol, Buspar, Lexapro, Zyprexa   Allergies: Zyprexa [olanzapine]  Current Medications:  Current Outpatient Medications:  .  amphetamine-dextroamphetamine (ADDERALL) 30 MG tablet, Take 1 tablet by mouth 2 (two) times daily., Disp: 60 tablet, Rfl: 0 .  [START ON 01/27/2019] amphetamine-dextroamphetamine (ADDERALL) 30 MG tablet, Take 1 tablet by mouth 2 (two) times daily., Disp: 60 tablet, Rfl: 0 .  [START ON 12/29/2018] amphetamine-dextroamphetamine (ADDERALL) 30 MG tablet, Take 1 tablet by mouth 2 (two) times daily., Disp: 60 tablet, Rfl: 0 .  [START ON 11/29/2018] amphetamine-dextroamphetamine (ADDERALL) 30 MG tablet, Take 1 tablet by mouth 2 (two) times daily., Disp: 60 tablet, Rfl: 0 .  escitalopram (LEXAPRO) 20 MG tablet, Take 1 tablet (20 mg total) by mouth daily., Disp: 90 tablet, Rfl: 0 .  etonogestrel-ethinyl estradiol (NUVARING) 0.12-0.015 MG/24HR vaginal ring, Place 1 each vaginally every 28 (twenty-eight) days. Insert vaginally and leave in place for 3 consecutive weeks, then remove for 1 week. , Disp: , Rfl:  .  lamoTRIgine (LAMICTAL) 100 MG tablet, TAKE ONE TABLET BY MOUTH TWICE A DAY, Disp: 180 tablet, Rfl: 0 .  Liraglutide -Weight Management (SAXENDA) 18 MG/3ML SOPN, Inject into the skin daily., Disp: , Rfl:  .  QUEtiapine (  SEROQUEL) 100 MG tablet, TAKE 1 TABLET AT BEDTIME, Disp: 90 tablet, Rfl: 0 .  ibuprofen (ADVIL,MOTRIN) 800 MG tablet, , Disp: , Rfl:  Medication Side Effects: none  Family Medical/ Social History: Changes? Yes She and husband have  split up, on Valentine's Day.  Husband moved out, living w/ his parents. "He says he's not happy, and wouldn't give me a reason."  She is also working from home.  MENTAL HEALTH EXAM:  There were no vitals taken for this visit.There is no height or weight on file to calculate BMI.  General Appearance: unable to assess  Eye Contact:  unable to assess  Speech:  Clear and Coherent  Volume:  Normal  Mood:  Euthymic  Affect:  unable to assess  Thought Process:  Goal Directed  Orientation:  Full (Time, Place, and Person)  Thought Content: Logical   Suicidal Thoughts:  No  Homicidal Thoughts:  No  Memory:  WNL  Judgement:  Good  Insight:  Good  Psychomotor Activity:  unable to assess  Concentration:  Concentration: Good and Attention Span: Good  Recall:  Good  Fund of Knowledge: Good  Language: Good  Assets:  Desire for Improvement  ADL's:  Intact  Cognition: WNL  Prognosis:  Good    DIAGNOSES:    ICD-10-CM   1. Marital stress  Z63.0   2. Attention deficit hyperactivity disorder (ADHD), combined type  F90.2   3. Adjustment disorder with mixed anxiety and depressed mood  F43.23     Receiving Psychotherapy: Yes  Ulice Boldarson Sarvis, [facility]   RECOMMENDATIONS:  PDMD reviewed. Cont Adderall 30 mg bid. Cont Lexapro 20 mg qd. Cont Lamictal 100 mg bid  Cont Seroquel 100mg  (she only takes a little bite off it, for sleep) Cont psychotherapy with Ulice Boldarson Sarvis, [facility]. Return in 6 months.  Melony Overlyeresa Hurst, PA-C   This record has been created using [employer] software.  Chart creation errors have been sought, but may not always have been located and corrected. Such creation errors do not reflect on the standard of medical care.

## 2018-11-29 ENCOUNTER — Telehealth: Payer: Self-pay | Admitting: Physician Assistant

## 2018-11-29 NOTE — Telephone Encounter (Signed)
Pt filled her Adderall 30 and they only had 56 pills. So she will be short of pills. She is asking if you can call in her another rx for the 4 and she can pick them up Wednesday.

## 2018-11-29 NOTE — Telephone Encounter (Signed)
Filled 10/31/2018 #60

## 2018-11-30 ENCOUNTER — Other Ambulatory Visit: Payer: Self-pay | Admitting: Physician Assistant

## 2018-11-30 DIAGNOSIS — F3131 Bipolar disorder, current episode depressed, mild: Secondary | ICD-10-CM

## 2018-11-30 MED ORDER — AMPHETAMINE-DEXTROAMPHETAMINE 30 MG PO TABS: 30.0000 mg | ORAL_TABLET | Freq: Two times a day (BID) | ORAL | 0 refills | Status: DC

## 2018-11-30 NOTE — Telephone Encounter (Signed)
Rx was sent  

## 2018-11-30 NOTE — Telephone Encounter (Signed)
I'll send in Rx so she can pick up on 12/26/2018.  What pharmacy?

## 2018-12-05 ENCOUNTER — Other Ambulatory Visit: Payer: Self-pay | Admitting: Physician Assistant

## 2018-12-21 DIAGNOSIS — E668 Other obesity: Secondary | ICD-10-CM | POA: Diagnosis not present

## 2018-12-21 DIAGNOSIS — F319 Bipolar disorder, unspecified: Secondary | ICD-10-CM | POA: Diagnosis not present

## 2018-12-21 DIAGNOSIS — L659 Nonscarring hair loss, unspecified: Secondary | ICD-10-CM | POA: Diagnosis not present

## 2018-12-21 DIAGNOSIS — I10 Essential (primary) hypertension: Secondary | ICD-10-CM | POA: Diagnosis not present

## 2018-12-23 ENCOUNTER — Other Ambulatory Visit: Payer: Self-pay | Admitting: Physician Assistant

## 2018-12-23 DIAGNOSIS — F988 Other specified behavioral and emotional disorders with onset usually occurring in childhood and adolescence: Secondary | ICD-10-CM

## 2019-01-05 DIAGNOSIS — I10 Essential (primary) hypertension: Secondary | ICD-10-CM | POA: Diagnosis not present

## 2019-01-05 DIAGNOSIS — Z23 Encounter for immunization: Secondary | ICD-10-CM | POA: Diagnosis not present

## 2019-01-05 DIAGNOSIS — E538 Deficiency of other specified B group vitamins: Secondary | ICD-10-CM | POA: Diagnosis not present

## 2019-01-13 DIAGNOSIS — Z6827 Body mass index (BMI) 27.0-27.9, adult: Secondary | ICD-10-CM | POA: Diagnosis not present

## 2019-01-13 DIAGNOSIS — R87810 Cervical high risk human papillomavirus (HPV) DNA test positive: Secondary | ICD-10-CM | POA: Diagnosis not present

## 2019-01-13 DIAGNOSIS — Z1151 Encounter for screening for human papillomavirus (HPV): Secondary | ICD-10-CM | POA: Diagnosis not present

## 2019-01-13 DIAGNOSIS — Z01419 Encounter for gynecological examination (general) (routine) without abnormal findings: Secondary | ICD-10-CM | POA: Diagnosis not present

## 2019-02-05 DIAGNOSIS — S6991XA Unspecified injury of right wrist, hand and finger(s), initial encounter: Secondary | ICD-10-CM | POA: Diagnosis not present

## 2019-02-05 DIAGNOSIS — M25531 Pain in right wrist: Secondary | ICD-10-CM | POA: Diagnosis not present

## 2019-02-10 DIAGNOSIS — D649 Anemia, unspecified: Secondary | ICD-10-CM | POA: Diagnosis not present

## 2019-02-22 ENCOUNTER — Encounter (HOSPITAL_COMMUNITY): Payer: Self-pay

## 2019-02-28 ENCOUNTER — Other Ambulatory Visit (HOSPITAL_COMMUNITY): Payer: Self-pay | Admitting: *Deleted

## 2019-03-01 ENCOUNTER — Other Ambulatory Visit: Payer: Self-pay

## 2019-03-01 ENCOUNTER — Encounter (HOSPITAL_COMMUNITY)
Admission: RE | Admit: 2019-03-01 | Discharge: 2019-03-01 | Disposition: A | Discharge status: Home / Self Care | Payer: BC Managed Care – PPO | Source: Ambulatory Visit | Attending: Internal Medicine | Admitting: Internal Medicine

## 2019-03-01 DIAGNOSIS — D649 Anemia, unspecified: Secondary | ICD-10-CM | POA: Diagnosis not present

## 2019-03-01 MED ORDER — SODIUM CHLORIDE 0.9 % IV SOLN
510.0000 mg | INTRAVENOUS | Status: DC
  Administered 2019-03-01: 510 mg via INTRAVENOUS
  Filled 2019-03-01: qty 17

## 2019-03-01 NOTE — Discharge Instructions (Signed)

## 2019-03-07 ENCOUNTER — Other Ambulatory Visit (HOSPITAL_COMMUNITY): Payer: Self-pay | Admitting: *Deleted

## 2019-03-08 ENCOUNTER — Encounter (HOSPITAL_COMMUNITY)
Admission: RE | Admit: 2019-03-08 | Discharge: 2019-03-08 | Disposition: A | Discharge status: Home / Self Care | Payer: BC Managed Care – PPO | Source: Ambulatory Visit | Attending: Internal Medicine | Admitting: Internal Medicine

## 2019-03-08 ENCOUNTER — Other Ambulatory Visit: Payer: Self-pay

## 2019-03-08 DIAGNOSIS — D649 Anemia, unspecified: Secondary | ICD-10-CM | POA: Diagnosis not present

## 2019-03-08 MED ORDER — SODIUM CHLORIDE 0.9 % IV SOLN
510.0000 mg | INTRAVENOUS | Status: AC
  Administered 2019-03-08: 510 mg via INTRAVENOUS
  Filled 2019-03-08: qty 510

## 2019-03-17 ENCOUNTER — Other Ambulatory Visit: Payer: Self-pay

## 2019-03-17 ENCOUNTER — Telehealth: Payer: Self-pay | Admitting: Physician Assistant

## 2019-03-17 MED ORDER — AMPHETAMINE-DEXTROAMPHETAMINE 30 MG PO TABS: 30.0000 mg | ORAL_TABLET | Freq: Two times a day (BID) | ORAL | 0 refills | Status: DC

## 2019-03-17 NOTE — Telephone Encounter (Signed)
Last refill 02/21/2019 3 Rx's for Adderall pended for [name] to submit

## 2019-03-17 NOTE — Telephone Encounter (Signed)
Please send in r[facility] [place] [place] ( [facility] pharm[facility] Can you do 3 refills for keeping on file. Apt 3/17

## 2019-03-24 ENCOUNTER — Telehealth: Payer: Self-pay | Admitting: Physician Assistant

## 2019-03-24 NOTE — Telephone Encounter (Signed)
Have her increase the Lamictal 100 mg from 1 po bid, to 1 po q am, and 1.5 pills qhs.  Keep appt sched 3/17

## 2019-03-24 NOTE — Telephone Encounter (Signed)
Pt notices an increase in depression and has several stressful life events. Wishes to get at it before it gets worse. Any suggestions to help?

## 2019-03-24 NOTE — Telephone Encounter (Signed)
Rtc to patient and given instructions to increase Lamictal 100 mg 1 every am, and 1.5 tablets at hs. Recommend she call back if symptoms worsening.

## 2019-03-28 DIAGNOSIS — D6489 Other specified anemias: Secondary | ICD-10-CM | POA: Diagnosis not present

## 2019-03-28 DIAGNOSIS — E7849 Other hyperlipidemia: Secondary | ICD-10-CM | POA: Diagnosis not present

## 2019-05-11 ENCOUNTER — Ambulatory Visit (INDEPENDENT_AMBULATORY_CARE_PROVIDER_SITE_OTHER): Payer: BC Managed Care – PPO | Admitting: Physician Assistant

## 2019-05-11 ENCOUNTER — Encounter: Payer: Self-pay | Admitting: Physician Assistant

## 2019-05-11 ENCOUNTER — Other Ambulatory Visit: Payer: Self-pay

## 2019-05-11 DIAGNOSIS — F3131 Bipolar disorder, current episode depressed, mild: Secondary | ICD-10-CM | POA: Diagnosis not present

## 2019-05-11 DIAGNOSIS — F900 Attention-deficit hyperactivity disorder, predominantly inattentive type: Secondary | ICD-10-CM | POA: Diagnosis not present

## 2019-05-11 DIAGNOSIS — Z630 Problems in relationship with spouse or partner: Secondary | ICD-10-CM

## 2019-05-11 MED ORDER — AMPHETAMINE-DEXTROAMPHETAMINE 30 MG PO TABS: 30.0000 mg | ORAL_TABLET | Freq: Two times a day (BID) | ORAL | 0 refills | Status: DC

## 2019-05-11 MED ORDER — ESCITALOPRAM OXALATE 20 MG PO TABS: 20.0000 mg | ORAL_TABLET | Freq: Every day | ORAL | 3 refills | Status: DC

## 2019-05-11 MED ORDER — LAMOTRIGINE 100 MG PO TABS: ORAL_TABLET | ORAL | 1 refills | Status: DC

## 2019-05-11 NOTE — Progress Notes (Signed)
[facility] Med Check  Patient ID: [name],  MRN: [id number]  PCP: Shon Baton, MD  Date of Evaluation: 05/11/2019 Time spent:20 minutes  Chief Complaint:  Chief Complaint    ADD; Depression; Follow-up     HISTORY/CURRENT STATUS: HPI For routine f/u.  Since the last visit, we increased the Lamictal. She had been feeling like she might be getting a little more depressed so called. Noticed that she was crying more, even when the situation warrants it. She and husband are still separated, she is sad about that at times. They may divorce but she's not quite ready yet.   States that attention is good without easy distractibility.  Able to focus on things and finish tasks to completion.   Patient denies loss of interest in usual activities and is able to enjoy things.  Denies decreased energy or motivation.  Appetite has not changed.  No extreme sadness, tearfulness, or feelings of hopelessness.  Denies suicidal or homicidal thoughts.  Patient denies increased energy with decreased need for sleep, no increased talkativeness, no racing thoughts, no impulsivity or risky behaviors, no increased spending, no increased libido, no grandiosity.  Denies dizziness, syncope, seizures, numbness, tingling, tremor, tics, unsteady gait, slurred speech, confusion. Denies muscle or joint pain, stiffness, or dystonia.  Individual Medical History/ Review of Systems: Changes? :No    Past medications for mental health diagnoses include: Adderall, Lamictal, Seroquel, Cymbalta, Tegretol, Buspar, Lexapro, Zyprexa   Allergies: Zyprexa [olanzapine]  Current Medications:  Current Outpatient Medications:  .  amphetamine-dextroamphetamine (ADDERALL) 30 MG tablet, Take 1 tablet by mouth 2 (two) times daily., Disp: 60 tablet, Rfl: 0 .  amphetamine-dextroamphetamine (ADDERALL) 30 MG tablet, Take 1 tablet by mouth 2 (two) times daily., Disp: 60 tablet, Rfl: 0 .  [START ON 07/14/2019]  amphetamine-dextroamphetamine (ADDERALL) 30 MG tablet, Take 1 tablet by mouth 2 (two) times daily., Disp: 60 tablet, Rfl: 0 .  [START ON 06/15/2019] amphetamine-dextroamphetamine (ADDERALL) 30 MG tablet, Take 1 tablet by mouth 2 (two) times daily., Disp: 60 tablet, Rfl: 0 .  [START ON 05/15/2019] amphetamine-dextroamphetamine (ADDERALL) 30 MG tablet, Take 1 tablet by mouth 2 (two) times daily., Disp: 60 tablet, Rfl: 0 .  escitalopram (LEXAPRO) 20 MG tablet, Take 1 tablet (20 mg total) by mouth daily., Disp: 90 tablet, Rfl: 3 .  etonogestrel-ethinyl estradiol (NUVARING) 0.12-0.015 MG/24HR vaginal ring, Place 1 each vaginally every 28 (twenty-eight) days. Insert vaginally and leave in place for 3 consecutive weeks, then remove for 1 week. , Disp: , Rfl:  .  ibuprofen (ADVIL,MOTRIN) 800 MG tablet, , Disp: , Rfl:  .  lamoTRIgine (LAMICTAL) 100 MG tablet, 1 po q am, 1.5mg  po qhs., Disp: 225 tablet, Rfl: 1 .  Liraglutide -Weight Management (SAXENDA) 18 MG/3ML SOPN, Inject into the skin daily., Disp: , Rfl:  .  QUEtiapine (SEROQUEL) 100 MG tablet, TAKE 1 TABLET BY MOUTH EVERYDAY AT BEDTIME (Patient taking differently: 25 mg. ), Disp: 90 tablet, Rfl: 0 Medication Side Effects: none  Family Medical/ Social History: Changes? Yes  She is also working from home.  MENTAL HEALTH EXAM:  There were no vitals taken for this visit.There is no height or weight on file to calculate BMI.  General Appearance: Casual, Neat, Well Groomed and Obese  Eye Contact:  Good  Speech:  Clear and Coherent  Volume:  Normal  Mood:  Euthymic  Affect:  Appropriate  Thought Process:  Goal Directed  Orientation:  Full (Time, Place, and Person)  Thought Content: Logical  Suicidal Thoughts:  No  Homicidal Thoughts:  No  Memory:  WNL  Judgement:  Good  Insight:  Good  Psychomotor Activity:  Normal  Concentration:  Concentration: Good and Attention Span: Good  Recall:  Good  Fund of Knowledge: Good  Language: Good  Assets:   Desire for Improvement  ADL's:  Intact  Cognition: WNL  Prognosis:  Good    DIAGNOSES:    ICD-10-CM   1. Attention deficit hyperactivity disorder (ADHD), predominantly inattentive type  F90.0   2. Bipolar affective disorder, currently depressed, mild (HCC)  F31.31 escitalopram (LEXAPRO) 20 MG tablet  3. Marital stress  Z63.0     Receiving Psychotherapy: Yes  [name], [facility]   RECOMMENDATIONS:  PDMD reviewed. I spent 20 mins w/ her.  Cont Adderall 30 mg bid. Cont Lexapro 20 mg qd. Cont Lamictal 100 mg po q am, and 150 mg qhs. Cont Seroquel 100mg  (she only takes a little bite off it, for sleep) Cont psychotherapy with , [facility]. Return in 6 months.  [facility], PA-C

## 2019-05-16 ENCOUNTER — Other Ambulatory Visit: Payer: Self-pay

## 2019-05-16 DIAGNOSIS — F988 Other specified behavioral and emotional disorders with onset usually occurring in childhood and adolescence: Secondary | ICD-10-CM

## 2019-05-16 MED ORDER — QUETIAPINE FUMARATE 100 MG PO TABS: ORAL_TABLET | ORAL | 0 refills | Status: DC

## 2019-06-14 DIAGNOSIS — L718 Other rosacea: Secondary | ICD-10-CM | POA: Diagnosis not present

## 2019-06-16 DIAGNOSIS — E7849 Other hyperlipidemia: Secondary | ICD-10-CM | POA: Diagnosis not present

## 2019-06-16 DIAGNOSIS — E538 Deficiency of other specified B group vitamins: Secondary | ICD-10-CM | POA: Diagnosis not present

## 2019-06-16 DIAGNOSIS — Z0000 Encounter for general adult medical examination without abnormal findings: Secondary | ICD-10-CM | POA: Diagnosis not present

## 2019-06-17 DIAGNOSIS — R82998 Other abnormal findings in urine: Secondary | ICD-10-CM | POA: Diagnosis not present

## 2019-06-23 DIAGNOSIS — L659 Nonscarring hair loss, unspecified: Secondary | ICD-10-CM | POA: Diagnosis not present

## 2019-06-23 DIAGNOSIS — D649 Anemia, unspecified: Secondary | ICD-10-CM | POA: Diagnosis not present

## 2019-06-23 DIAGNOSIS — F319 Bipolar disorder, unspecified: Secondary | ICD-10-CM | POA: Diagnosis not present

## 2019-06-23 DIAGNOSIS — Z1331 Encounter for screening for depression: Secondary | ICD-10-CM | POA: Diagnosis not present

## 2019-06-23 DIAGNOSIS — Z0000 Encounter for general adult medical examination without abnormal findings: Secondary | ICD-10-CM | POA: Diagnosis not present

## 2019-06-23 DIAGNOSIS — D72819 Decreased white blood cell count, unspecified: Secondary | ICD-10-CM | POA: Diagnosis not present

## 2019-07-06 ENCOUNTER — Telehealth: Payer: Self-pay | Admitting: Physician Assistant

## 2019-07-06 ENCOUNTER — Other Ambulatory Visit: Payer: Self-pay | Admitting: Physician Assistant

## 2019-07-06 MED ORDER — AMPHETAMINE-DEXTROAMPHETAMINE 30 MG PO TABS: 30.0000 mg | ORAL_TABLET | Freq: Two times a day (BID) | ORAL | 0 refills | Status: DC

## 2019-07-06 NOTE — Telephone Encounter (Signed)
Pt would like to know if she can get a early refill on her Adderall. Pt said that the pharmacy can't fill until 5/20. Last month it was filled on the 17th. [facility] [place]

## 2019-07-06 NOTE — Telephone Encounter (Signed)
Rx was sent  

## 2019-08-12 ENCOUNTER — Other Ambulatory Visit: Payer: Self-pay | Admitting: Internal Medicine

## 2019-08-12 DIAGNOSIS — Z1231 Encounter for screening mammogram for malignant neoplasm of breast: Secondary | ICD-10-CM

## 2019-08-19 DIAGNOSIS — N898 Other specified noninflammatory disorders of vagina: Secondary | ICD-10-CM | POA: Diagnosis not present

## 2019-08-19 DIAGNOSIS — N760 Acute vaginitis: Secondary | ICD-10-CM | POA: Diagnosis not present

## 2019-09-08 DIAGNOSIS — L821 Other seborrheic keratosis: Secondary | ICD-10-CM | POA: Diagnosis not present

## 2019-09-08 DIAGNOSIS — L281 Prurigo nodularis: Secondary | ICD-10-CM | POA: Diagnosis not present

## 2019-09-08 DIAGNOSIS — L0101 Non-bullous impetigo: Secondary | ICD-10-CM | POA: Diagnosis not present

## 2019-09-08 DIAGNOSIS — L0889 Other specified local infections of the skin and subcutaneous tissue: Secondary | ICD-10-CM | POA: Diagnosis not present

## 2019-09-26 ENCOUNTER — Other Ambulatory Visit: Payer: Self-pay

## 2019-09-26 ENCOUNTER — Ambulatory Visit
Admission: RE | Admit: 2019-09-26 | Discharge: 2019-09-26 | Disposition: A | Discharge status: Home / Self Care | Payer: BC Managed Care – PPO | Source: Ambulatory Visit | Attending: Internal Medicine | Admitting: Internal Medicine

## 2019-09-26 ENCOUNTER — Telehealth: Payer: Self-pay | Admitting: Physician Assistant

## 2019-09-26 DIAGNOSIS — Z1231 Encounter for screening mammogram for malignant neoplasm of breast: Secondary | ICD-10-CM

## 2019-09-26 MED ORDER — AMPHETAMINE-DEXTROAMPHETAMINE 30 MG PO TABS: 30.0000 mg | ORAL_TABLET | Freq: Two times a day (BID) | ORAL | 0 refills | Status: DC

## 2019-09-26 NOTE — Telephone Encounter (Signed)
Last refill 09/02/2019 Pended with appropriate fill date. Has apt 10/2019

## 2019-09-26 NOTE — Telephone Encounter (Signed)
Pt would like a rx for Adderall 30mg  [facility] [place]

## 2019-10-26 ENCOUNTER — Telehealth: Payer: Self-pay | Admitting: Physician Assistant

## 2019-10-26 ENCOUNTER — Other Ambulatory Visit: Payer: Self-pay | Admitting: Physician Assistant

## 2019-10-26 MED ORDER — AMPHETAMINE-DEXTROAMPHETAMINE 30 MG PO TABS: 30.0000 mg | ORAL_TABLET | Freq: Two times a day (BID) | ORAL | 0 refills | Status: DC

## 2019-10-26 NOTE — Telephone Encounter (Signed)
Prescription was sent

## 2019-10-26 NOTE — Telephone Encounter (Signed)
Pt called requesting a refill on the Adderall. [facility] [place]. Follow up appt scheduled for 9/17.

## 2019-10-28 ENCOUNTER — Other Ambulatory Visit: Payer: Self-pay | Admitting: Physician Assistant

## 2019-11-11 ENCOUNTER — Encounter: Payer: Self-pay | Admitting: Physician Assistant

## 2019-11-11 ENCOUNTER — Ambulatory Visit (INDEPENDENT_AMBULATORY_CARE_PROVIDER_SITE_OTHER): Payer: BC Managed Care – PPO | Admitting: Physician Assistant

## 2019-11-11 ENCOUNTER — Other Ambulatory Visit: Payer: Self-pay

## 2019-11-11 VITALS — BP 142/95 | HR 92

## 2019-11-11 DIAGNOSIS — F4310 Post-traumatic stress disorder, unspecified: Secondary | ICD-10-CM | POA: Diagnosis not present

## 2019-11-11 DIAGNOSIS — F319 Bipolar disorder, unspecified: Secondary | ICD-10-CM | POA: Diagnosis not present

## 2019-11-11 DIAGNOSIS — F902 Attention-deficit hyperactivity disorder, combined type: Secondary | ICD-10-CM

## 2019-11-11 MED ORDER — AMPHETAMINE-DEXTROAMPHETAMINE 30 MG PO TABS: 30.0000 mg | ORAL_TABLET | Freq: Two times a day (BID) | ORAL | 0 refills | Status: DC

## 2019-11-11 NOTE — Progress Notes (Signed)
[facility]  Patient ID: [name],  MRN: [id number]  PCP: Creola Corn, MD  Date of Evaluation: 11/11/2019 Time spent:20 minutes  Chief Complaint:  Chief Complaint    Follow-up; ADHD; Depression     HISTORY/CURRENT STATUS: HPI For routine f/u.  Is doing really well. 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 She is seeing someone.   States that attention is good without easy distractibility.  Able to focus on things and finish tasks to completion.   Patient denies loss of interest in usual activities and is able to enjoy things.  Denies decreased energy or motivation.  Appetite has not changed.  No extreme sadness, tearfulness, or feelings of hopelessness.  She is able to cry when needed, but not so easy like she did before. Denies suicidal or homicidal thoughts.  Patient denies increased energy with decreased need for sleep, no increased talkativeness, no racing thoughts, no impulsivity or risky behaviors, no increased spending, no increased libido, no grandiosity.  Denies dizziness, syncope, seizures, numbness, tingling, tremor, tics, unsteady gait, slurred speech, confusion. Denies muscle or joint pain, stiffness, or dystonia.  Individual Medical History/ Review of Systems: Changes? :No    Past medications for mental health diagnoses include: Adderall, Lamictal, Seroquel, Cymbalta, Tegretol, Buspar, Lexapro, Zyprexa   Allergies: Zyprexa [olanzapine]  Current Medications:  Current Outpatient Medications:  .  [START ON 11/24/2019] amphetamine-dextroamphetamine (ADDERALL) 30 MG tablet, Take 1 tablet by mouth 2 (two) times daily., Disp: 60 tablet, Rfl: 0 .  [START ON 12/23/2019] amphetamine-dextroamphetamine (ADDERALL) 30 MG tablet, Take 1 tablet by mouth 2 (two) times daily., Disp: 60 tablet, Rfl: 0 .  [START ON 01/22/2020]  amphetamine-dextroamphetamine (ADDERALL) 30 MG tablet, Take 1 tablet by mouth 2 (two) times daily., Disp: 60 tablet, Rfl: 0 .  escitalopram (LEXAPRO) 20 MG tablet, Take 1 tablet (20 mg total) by mouth daily., Disp: 90 tablet, Rfl: 3 .  etonogestrel-ethinyl estradiol (NUVARING) 0.12-0.015 MG/24HR vaginal ring, Place 1 each vaginally every 28 (twenty-eight) days. Insert vaginally and leave in place for 3 consecutive weeks, then remove for 1 week. , Disp: , Rfl:  .  ibuprofen (ADVIL,MOTRIN) 800 MG tablet, , Disp: , Rfl:  .  lamoTRIgine (LAMICTAL) 100 MG tablet, TAKE 1 TAB IN THE MORNING AND 1 & 1/2 TABS AT BEDTIME, Disp: 225 tablet, Rfl: 0 .  Liraglutide -Weight Management (SAXENDA) 18 MG/3ML SOPN, Inject into the skin daily., Disp: , Rfl:  .  QUEtiapine (SEROQUEL) 100 MG tablet, TAKE 1 TABLET BY MOUTH EVERYDAY AT BEDTIME, Disp: 90 tablet, Rfl: 0 Medication Side Effects: none  Family Medical/ Social History: Changes? Yes  Still working from home.   MENTAL HEALTH EXAM:  Blood pressure (!) 142/95, pulse 92.There is no height or weight on file to calculate BMI.  General Appearance: Casual, Neat, Well Groomed and Obese  Eye Contact:  Good  Speech:  Clear and Coherent and Normal Rate  Volume:  Normal  Mood:  Euthymic  Affect:  Appropriate  Thought Process:  Goal Directed and Descriptions of Associations: Intact  Orientation:  Full (Time, Place, and Person)  Thought Content: Logical   Suicidal Thoughts:  No  Homicidal Thoughts:  No  Memory:  WNL  Judgement:  Good  Insight:  Good  Psychomotor Activity:  Normal  Concentration:  Concentration: Good and Attention Span: Good  Recall:  Good  Fund of Knowledge:  Good  Language: Good  Assets:  Desire for Improvement  ADL's:  Intact  Cognition: WNL  Prognosis:  Good    DIAGNOSES:    ICD-10-CM   1. Attention deficit hyperactivity disorder (ADHD), combined type  F90.2   2. Bipolar I disorder (HCC)  F31.9   3. PTSD (post-traumatic stress  disorder)  F43.10     Receiving Psychotherapy: Yes  [name], [facility]   RECOMMENDATIONS:  PDMD reviewed. I provided 20 mins with her during this encounter.  Cont Adderall 30 mg bid. Cont Lexapro 20 mg qd. Cont Lamictal 100 mg po q am, and 150 mg qhs. Cont Seroquel 100mg  (she only takes a little bite off it, for sleep) Cont psychotherapy with , [facility]. Return in 6 months.  [facility], PA-C

## 2019-12-20 DIAGNOSIS — D649 Anemia, unspecified: Secondary | ICD-10-CM | POA: Diagnosis not present

## 2019-12-20 DIAGNOSIS — Z7689 Persons encountering health services in other specified circumstances: Secondary | ICD-10-CM | POA: Diagnosis not present

## 2019-12-20 DIAGNOSIS — E538 Deficiency of other specified B group vitamins: Secondary | ICD-10-CM | POA: Diagnosis not present

## 2019-12-20 DIAGNOSIS — I10 Essential (primary) hypertension: Secondary | ICD-10-CM | POA: Diagnosis not present

## 2019-12-20 DIAGNOSIS — Z23 Encounter for immunization: Secondary | ICD-10-CM | POA: Diagnosis not present

## 2019-12-20 DIAGNOSIS — E785 Hyperlipidemia, unspecified: Secondary | ICD-10-CM | POA: Diagnosis not present

## 2019-12-28 ENCOUNTER — Telehealth: Payer: Self-pay

## 2019-12-28 NOTE — Telephone Encounter (Signed)
Prior authorization submitted and approved for AMPHETAMINE-DEXTROAMPHETAMINE 30 MG #60/30 DAY effective 12/28/2019-12/27/2022 with [facility].

## 2019-12-29 ENCOUNTER — Telehealth: Payer: Self-pay | Admitting: Physician Assistant

## 2019-12-29 NOTE — Telephone Encounter (Signed)
Loella dropped off FMLA forms.

## 2020-01-02 ENCOUNTER — Telehealth: Payer: Self-pay | Admitting: Physician Assistant

## 2020-01-02 DIAGNOSIS — Z0289 Encounter for other administrative examinations: Secondary | ICD-10-CM

## 2020-01-02 NOTE — Telephone Encounter (Signed)
FMLA form has been faxed.

## 2020-01-02 NOTE — Telephone Encounter (Signed)
Paper work completed and given to Teresa to review and sign. 

## 2020-01-02 NOTE — Telephone Encounter (Signed)
FMLA form was signed and placed in the Admin's box

## 2020-01-03 NOTE — Telephone Encounter (Signed)
noted 

## 2020-01-12 ENCOUNTER — Other Ambulatory Visit: Payer: Self-pay | Admitting: Physician Assistant

## 2020-01-26 DIAGNOSIS — Z6827 Body mass index (BMI) 27.0-27.9, adult: Secondary | ICD-10-CM | POA: Diagnosis not present

## 2020-01-26 DIAGNOSIS — Z1151 Encounter for screening for human papillomavirus (HPV): Secondary | ICD-10-CM | POA: Diagnosis not present

## 2020-01-26 DIAGNOSIS — Z01419 Encounter for gynecological examination (general) (routine) without abnormal findings: Secondary | ICD-10-CM | POA: Diagnosis not present

## 2020-02-08 ENCOUNTER — Ambulatory Visit: Payer: BC Managed Care – PPO | Admitting: Plastic Surgery

## 2020-02-08 ENCOUNTER — Encounter: Payer: Self-pay | Admitting: Plastic Surgery

## 2020-02-08 ENCOUNTER — Other Ambulatory Visit: Payer: Self-pay

## 2020-02-08 VITALS — BP 142/94 | HR 87 | Temp 98.4°F | Ht 61.0 in | Wt 149.4 lb

## 2020-02-08 DIAGNOSIS — M793 Panniculitis, unspecified: Secondary | ICD-10-CM | POA: Diagnosis not present

## 2020-02-08 DIAGNOSIS — Z411 Encounter for cosmetic surgery: Secondary | ICD-10-CM

## 2020-02-08 NOTE — Progress Notes (Signed)
Referring Provider Creola Corn, MD [address] [place],  [place] [address]   CC:  Chief Complaint  Patient presents with  . Advice Only      [name] is an 39 y.o. female.  HPI: Patient presents to discuss her abdominal contour.  She has had several weight loss surgeries in the past including lap band which was eventually removed followed by gastric bypass in 2013.  At her heaviest she was 220 pounds and has settled in at about 150 pounds for the last few years.  In the weight loss she has gained some skin laxity in her abdomen which bothers her.  The excess skin can pinch in her close and causes intermittent skin irritation in the creases.  These been refractory to over-the-counter treatments and medications.  She is interested in surgical surgical correction of this problem.  She is also bothered by the upper abdomen contour as well.  Allergies  Allergen Reactions  . Zyprexa [Olanzapine]     Believe break out    Outpatient Encounter Medications as of 02/08/2020  Medication Sig Note  . amphetamine-dextroamphetamine (ADDERALL) 30 MG tablet Take 1 tablet by mouth 2 (two) times daily.   Marland Kitchen amphetamine-dextroamphetamine (ADDERALL) 30 MG tablet Take 1 tablet by mouth 2 (two) times daily.   Marland Kitchen amphetamine-dextroamphetamine (ADDERALL) 30 MG tablet Take 1 tablet by mouth 2 (two) times daily.   Marland Kitchen atenolol (TENORMIN) 25 MG tablet Take 25 mg by mouth daily.   Marland Kitchen escitalopram (LEXAPRO) 20 MG tablet Take 1 tablet (20 mg total) by mouth daily.   Marland Kitchen etonogestrel-ethinyl estradiol (NUVARING) 0.12-0.015 MG/24HR vaginal ring Place 1 each vaginally every 28 (twenty-eight) days. Insert vaginally and leave in place for 3 consecutive weeks, then remove for 1 week.   Marland Kitchen ibuprofen (ADVIL,MOTRIN) 800 MG tablet  08/17/2013: Received from: External Pharmacy  . lamoTRIgine (LAMICTAL) 100 MG tablet TAKE 1 TAB IN THE MORNING AND 1 & 1/2 TABS AT BEDTIME   . Liraglutide -Weight Management 18 MG/3ML SOPN  Inject into the skin daily.   . QUEtiapine (SEROQUEL) 100 MG tablet TAKE 1 TABLET BY MOUTH EVERYDAY AT BEDTIME    No facility-administered encounter medications on file as of 02/08/2020.     Past Medical History:  Diagnosis Date  . Anxiety   . Bipolar disorder (HCC)   . Depression     Past Surgical History:  Procedure Laterality Date  . CESAREAN SECTION    . CHOLECYSTECTOMY    . gastric band removal  2011  . GASTRIC BYPASS  2012  . LAPAROSCOPIC GASTRIC BANDING  2009    Family History  Problem Relation Age of Onset  . Cancer Mother        skin  . Bipolar disorder Mother   . Anxiety disorder Mother   . Alcohol abuse Father   . Hypertension Maternal Grandmother     Social History   Social History Narrative  . Not on file  Denies tobacco use  Review of Systems General: Denies fevers, chills, weight loss CV: Denies chest pain, shortness of breath, palpitations  Physical Exam Vitals with BMI 02/08/2020 03/08/2019 03/08/2019  Height 5\' 1"  - 5\' 1"   Weight 149 lbs 6 oz - 150 lbs  BMI 28.24 - 28.36  Systolic 142 118  Diastolic 94 83 78  Pulse 87 86 94  Some encounter information is confidential and restricted. Go to Review Flowsheets activity to see all data.    General:  No acute distress,  Alert and oriented, Non-Toxic, Normal speech and affect Abdomen: Abdomen is soft nontender.  I do not appreciate any hernias.  She has several laparoscopic port scars that seem well-healed.  She has a bit of a lip of the overhanging skin inferiorly which looks like it could pension close and rolled up when she is in a seated position.  She has stretch marks and skin atrophy in the infraumbilical area.  Assessment/Plan I think the patient is a good candidate for abdominal contouring.  The best operation for her would be to remove all excess skin, perform some upper abdominal liposuction in addition to a plication.  She may be a candidate for insurance to cover removal of the excess  skin but we will have to check on the details of her policy.  I discussed the risk of the procedure to her to include bleeding, infection, damage surrounding structures and need for additional procedures.  I explained the possibility for wound healing complications and the need for drains postoperatively.  I explained the expected postoperative recovery.  I explained the risk of persistent contour irregularities postoperatively but I do think she would be happy with her result with this operative plan.  All of her questions were answered and we will plan to move forward.  Allena Napoleon 02/08/2020, 6:13 PM

## 2020-02-21 ENCOUNTER — Telehealth: Payer: Self-pay | Admitting: Physician Assistant

## 2020-02-21 ENCOUNTER — Other Ambulatory Visit: Payer: Self-pay | Admitting: Physician Assistant

## 2020-02-21 MED ORDER — AMPHETAMINE-DEXTROAMPHETAMINE 30 MG PO TABS: 30.0000 mg | ORAL_TABLET | Freq: Two times a day (BID) | ORAL | 0 refills | Status: DC

## 2020-02-21 NOTE — Telephone Encounter (Signed)
Pt called and asked for a refill on her [facility]derall [facility] [place]

## 2020-02-21 NOTE — Telephone Encounter (Signed)
Prescriptions were sent

## 2020-02-27 DIAGNOSIS — R87810 Cervical high risk human papillomavirus (HPV) DNA test positive: Secondary | ICD-10-CM | POA: Diagnosis not present

## 2020-02-27 DIAGNOSIS — R87612 Low grade squamous intraepithelial lesion on cytologic smear of cervix (LGSIL): Secondary | ICD-10-CM | POA: Diagnosis not present

## 2020-04-08 ENCOUNTER — Other Ambulatory Visit: Payer: Self-pay | Admitting: Physician Assistant

## 2020-05-10 ENCOUNTER — Other Ambulatory Visit: Payer: Self-pay

## 2020-05-10 ENCOUNTER — Ambulatory Visit (INDEPENDENT_AMBULATORY_CARE_PROVIDER_SITE_OTHER): Payer: BC Managed Care – PPO | Admitting: Physician Assistant

## 2020-05-10 ENCOUNTER — Encounter: Payer: Self-pay | Admitting: Physician Assistant

## 2020-05-10 DIAGNOSIS — F3170 Bipolar disorder, currently in remission, most recent episode unspecified: Secondary | ICD-10-CM

## 2020-05-10 DIAGNOSIS — F902 Attention-deficit hyperactivity disorder, combined type: Secondary | ICD-10-CM | POA: Diagnosis not present

## 2020-05-10 DIAGNOSIS — F3131 Bipolar disorder, current episode depressed, mild: Secondary | ICD-10-CM

## 2020-05-10 MED ORDER — ESCITALOPRAM OXALATE 20 MG PO TABS: 20.0000 mg | ORAL_TABLET | Freq: Every day | ORAL | 1 refills | Status: DC

## 2020-05-10 MED ORDER — AMPHETAMINE-DEXTROAMPHETAMINE 30 MG PO TABS: 30.0000 mg | ORAL_TABLET | Freq: Two times a day (BID) | ORAL | 0 refills | Status: DC

## 2020-05-10 MED ORDER — LAMOTRIGINE 100 MG PO TABS: ORAL_TABLET | ORAL | 1 refills | Status: DC

## 2020-05-10 NOTE — Progress Notes (Signed)
[facility]  Patient ID: [name],  MRN: [id number]  PCP: Creola Corn, MD  Date of Evaluation: 05/10/2020 Time spent:20 minutes  Chief Complaint:  Chief Complaint    Anxiety; Depression; ADD     HISTORY/CURRENT STATUS: For routine f/u.  She is doing well. Work is fine.  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  Sleeps good. Is exercising, walking her dog at least an hour daily.  Work is busy but going well.  Energy and motivation are good.  No change in appetite.  Does not cry easily.  ADLs are normal.  Denies suicidal or homicidal thoughts.  Attention is good without easy distractibility.  Able to focus on things and finish tasks to completion.   Patient denies increased energy with decreased need for sleep, no increased talkativeness, no racing thoughts, no impulsivity or risky behaviors, no increased spending, no increased libido, no grandiosity.  Denies dizziness, syncope, seizures, numbness, tingling, tremor, tics, unsteady gait, slurred speech, confusion. Denies muscle or joint pain, stiffness, or dystonia.  Individual Medical History/ Review of Systems: Changes? :No    Past medications for mental health diagnoses include: Adderall, Lamictal, Seroquel, Cymbalta, Tegretol, Buspar, Lexapro, Zyprexa   Allergies: Zyprexa [olanzapine]  Current Medications:  Current Outpatient Medications:  .  atenolol (TENORMIN) 25 MG tablet, Take 25 mg by mouth daily., Disp: , Rfl:  .  etonogestrel-ethinyl estradiol (NUVARING) 0.12-0.015 MG/24HR vaginal ring, Place 1 each vaginally every 28 (twenty-eight) days. Insert vaginally and leave in place for 3 consecutive weeks, then remove for 1 week., Disp: , Rfl:  .  ibuprofen (ADVIL,MOTRIN) 800 MG tablet, , Disp: , Rfl:  .  Liraglutide -Weight Management 18 MG/3ML SOPN, Inject into the skin daily., Disp: , Rfl:  .  QUEtiapine (SEROQUEL) 100 MG tablet, TAKE 1 TABLET BY MOUTH EVERYDAY AT  BEDTIME, Disp: 90 tablet, Rfl: 0 .  [START ON 07/16/2020] amphetamine-dextroamphetamine (ADDERALL) 30 MG tablet, Take 1 tablet by mouth 2 (two) times daily., Disp: 60 tablet, Rfl: 0 .  [START ON 06/17/2020] amphetamine-dextroamphetamine (ADDERALL) 30 MG tablet, Take 1 tablet by mouth 2 (two) times daily., Disp: 60 tablet, Rfl: 0 .  amphetamine-dextroamphetamine (ADDERALL) 30 MG tablet, Take 1 tablet by mouth 2 (two) times daily., Disp: 60 tablet, Rfl: 0 .  escitalopram (LEXAPRO) 20 MG tablet, Take 1 tablet (20 mg total) by mouth daily., Disp: 90 tablet, Rfl: 1 .  lamoTRIgine (LAMICTAL) 100 MG tablet, 1 q am and 1.5 q hs, Disp: 225 tablet, Rfl: 1 Medication Side Effects: none  Family Medical/ Social History: Changes?  No  MENTAL HEALTH EXAM:  There were no vitals taken for this visit.There is no height or weight on file to calculate BMI.  General Appearance: Casual, Neat, Well Groomed and Obese  Eye Contact:  Good  Speech:  Clear and Coherent and Normal Rate  Volume:  Normal  Mood:  Euthymic  Affect:  Appropriate  Thought Process:  Goal Directed and Descriptions of Associations: Intact  Orientation:  Full (Time, Place, and Person)  Thought Content: Logical   Suicidal Thoughts:  No  Homicidal Thoughts:  No  Memory:  WNL  Judgement:  Good  Insight:  Good  Psychomotor Activity:  Normal  Concentration:  Concentration: Good and Attention Span: Good  Recall:  Good  Fund of Knowledge: Good  Language: Good  Assets:  Desire for Improvement  ADL's:  Intact  Cognition: WNL  Prognosis:  Good    DIAGNOSES:  ICD-10-CM   1. Attention deficit hyperactivity disorder (ADHD), combined type  F90.2   2. Bipolar disorder in remission (HCC)  F31.70 escitalopram (LEXAPRO) 20 MG tablet    Receiving Psychotherapy: Yes  [name], [facility]   RECOMMENDATIONS:  PDMD reviewed. I provided 20 minutes of face-to-face time during this encounter, including time spent before and after the appointment  and chart review. She is doing well so no changes are necessary. Cont Adderall 30 mg bid. Cont Lexapro 20 mg qd. Cont Lamictal 100 mg po q am, and 150 mg qhs. Cont Seroquel 100 mg (she only takes a little bite off it, for sleep) Cont psychotherapy with [name], [facility]. Return in 6 months.  Melony Overly, PA-C

## 2020-05-23 ENCOUNTER — Telehealth: Payer: Self-pay | Admitting: Physician Assistant

## 2020-05-23 NOTE — Telephone Encounter (Signed)
Received fax from [facility] on [address] for completion of FMLA form. Have placed on Traci's desk.

## 2020-05-29 ENCOUNTER — Telehealth: Payer: Self-pay | Admitting: Physician Assistant

## 2020-05-29 NOTE — Telephone Encounter (Signed)
Pt called and said that the section 7 and 8 need to be filled out correctly and faxed to [employer]. She said that a new company has taken over.

## 2020-05-30 NOTE — Telephone Encounter (Signed)
Contacted pt and clarified. Will complete pw and get [name] to review and sign

## 2020-06-01 DIAGNOSIS — Z0289 Encounter for other administrative examinations: Secondary | ICD-10-CM

## 2020-06-13 ENCOUNTER — Telehealth: Payer: Self-pay | Admitting: Physician Assistant

## 2020-06-13 NOTE — Telephone Encounter (Signed)
reviewed

## 2020-06-13 NOTE — Telephone Encounter (Signed)
There is no mental health reason that she should work from home. I cannot support her continuing to work at home unless something changes.

## 2020-06-13 NOTE — Telephone Encounter (Signed)
Please review

## 2020-06-13 NOTE — Telephone Encounter (Signed)
Pt informed

## 2020-06-13 NOTE — Telephone Encounter (Signed)
[name] called wanting to know if you would support her continuing to work from home versus returning to work.  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  Please call to discuss.

## 2020-06-18 DIAGNOSIS — E785 Hyperlipidemia, unspecified: Secondary | ICD-10-CM | POA: Diagnosis not present

## 2020-06-25 DIAGNOSIS — Z1339 Encounter for screening examination for other mental health and behavioral disorders: Secondary | ICD-10-CM | POA: Diagnosis not present

## 2020-06-25 DIAGNOSIS — Z0000 Encounter for general adult medical examination without abnormal findings: Secondary | ICD-10-CM | POA: Diagnosis not present

## 2020-06-25 DIAGNOSIS — R82998 Other abnormal findings in urine: Secondary | ICD-10-CM | POA: Diagnosis not present

## 2020-06-25 DIAGNOSIS — I10 Essential (primary) hypertension: Secondary | ICD-10-CM | POA: Diagnosis not present

## 2020-06-25 DIAGNOSIS — E785 Hyperlipidemia, unspecified: Secondary | ICD-10-CM | POA: Diagnosis not present

## 2020-06-25 DIAGNOSIS — Z1331 Encounter for screening for depression: Secondary | ICD-10-CM | POA: Diagnosis not present

## 2020-08-13 ENCOUNTER — Other Ambulatory Visit: Payer: Self-pay

## 2020-08-13 ENCOUNTER — Telehealth: Payer: Self-pay | Admitting: Physician Assistant

## 2020-08-13 NOTE — Telephone Encounter (Signed)
Last refill 07/16/20  3 Rx's Pended for [name] to send

## 2020-08-13 NOTE — Telephone Encounter (Signed)
Pt requesting Rx for Adderall 30 mg 2/d @ [facility] [place]. [address]

## 2020-08-14 MED ORDER — AMPHETAMINE-DEXTROAMPHETAMINE 30 MG PO TABS: 30.0000 mg | ORAL_TABLET | Freq: Two times a day (BID) | ORAL | 0 refills | Status: DC

## 2020-08-14 MED ORDER — AMPHETAMINE-DEXTROAMPHETAMINE 30 MG PO TABS
30.0000 mg | ORAL_TABLET | Freq: Two times a day (BID) | ORAL | 0 refills | Status: DC
Start: 2020-10-13 — End: 2020-11-12

## 2020-11-07 ENCOUNTER — Ambulatory Visit: Payer: BC Managed Care – PPO | Admitting: Physician Assistant

## 2020-11-09 ENCOUNTER — Other Ambulatory Visit: Payer: Self-pay | Admitting: Physician Assistant

## 2020-11-09 DIAGNOSIS — F3170 Bipolar disorder, currently in remission, most recent episode unspecified: Secondary | ICD-10-CM

## 2020-11-12 ENCOUNTER — Telehealth: Payer: Self-pay | Admitting: Physician Assistant

## 2020-11-12 ENCOUNTER — Other Ambulatory Visit: Payer: Self-pay

## 2020-11-12 MED ORDER — AMPHETAMINE-DEXTROAMPHETAMINE 30 MG PO TABS: 30.0000 mg | ORAL_TABLET | Freq: Two times a day (BID) | ORAL | 0 refills | Status: DC

## 2020-11-12 NOTE — Telephone Encounter (Signed)
Pt called requesting Rx for Adderall 30 mg 2/d to new pharmacy due to shortage. [facility] [phone]. [address]

## 2020-11-12 NOTE — Telephone Encounter (Signed)
Pended.

## 2020-12-11 ENCOUNTER — Telehealth: Payer: Self-pay | Admitting: Physician Assistant

## 2020-12-11 ENCOUNTER — Other Ambulatory Visit: Payer: Self-pay

## 2020-12-11 MED ORDER — AMPHETAMINE-DEXTROAMPHETAMINE 30 MG PO TABS: 30.0000 mg | ORAL_TABLET | Freq: Two times a day (BID) | ORAL | 0 refills | Status: DC

## 2020-12-11 NOTE — Telephone Encounter (Signed)
Pt called requesting [facility] [place], [place]  [id number] [place], [place] - [address] AT [facility]  [address] [place] [place] [address]  Phone:  [phone]  Fax:  [phone]   Next appt 10/28

## 2020-12-11 NOTE — Telephone Encounter (Signed)
Pended.

## 2020-12-17 ENCOUNTER — Other Ambulatory Visit: Payer: Self-pay | Admitting: Physician Assistant

## 2020-12-18 DIAGNOSIS — I10 Essential (primary) hypertension: Secondary | ICD-10-CM | POA: Diagnosis not present

## 2020-12-18 DIAGNOSIS — Z23 Encounter for immunization: Secondary | ICD-10-CM | POA: Diagnosis not present

## 2020-12-18 DIAGNOSIS — E538 Deficiency of other specified B group vitamins: Secondary | ICD-10-CM | POA: Diagnosis not present

## 2020-12-18 DIAGNOSIS — L659 Nonscarring hair loss, unspecified: Secondary | ICD-10-CM | POA: Diagnosis not present

## 2020-12-18 DIAGNOSIS — Z9884 Bariatric surgery status: Secondary | ICD-10-CM | POA: Diagnosis not present

## 2020-12-18 DIAGNOSIS — E785 Hyperlipidemia, unspecified: Secondary | ICD-10-CM | POA: Diagnosis not present

## 2020-12-21 ENCOUNTER — Telehealth: Payer: Self-pay | Admitting: Physician Assistant

## 2020-12-21 ENCOUNTER — Ambulatory Visit: Payer: BC Managed Care – PPO | Admitting: Physician Assistant

## 2020-12-21 DIAGNOSIS — F988 Other specified behavioral and emotional disorders with onset usually occurring in childhood and adolescence: Secondary | ICD-10-CM

## 2020-12-21 DIAGNOSIS — F3170 Bipolar disorder, currently in remission, most recent episode unspecified: Secondary | ICD-10-CM

## 2020-12-21 MED ORDER — AMPHETAMINE-DEXTROAMPHETAMINE 30 MG PO TABS: 30.0000 mg | ORAL_TABLET | Freq: Two times a day (BID) | ORAL | 0 refills | Status: DC

## 2020-12-21 MED ORDER — QUETIAPINE FUMARATE 100 MG PO TABS: ORAL_TABLET | ORAL | 0 refills | Status: DC

## 2020-12-21 MED ORDER — ESCITALOPRAM OXALATE 20 MG PO TABS: 20.0000 mg | ORAL_TABLET | Freq: Every day | ORAL | 3 refills | Status: DC

## 2020-12-21 MED ORDER — LAMOTRIGINE 100 MG PO TABS: ORAL_TABLET | ORAL | 3 refills | Status: DC

## 2020-12-21 NOTE — Telephone Encounter (Signed)
Patient is in [place] therefore we are unable to do a telehealth visit.  She is calling the office to reschedule for 6 weeks from now. She did want me to know she is having gastric bypass surgery revision on 12/28/2020. I will send in any refills that she needs up until her next appointment.  We may need to change doses depending on her weight loss and the efficacy of the medicine but will wait and see.

## 2020-12-24 NOTE — Progress Notes (Signed)
Patient was in [place] at the time of the visit.  I am not licensed in [place], therefore unable to go ahead with the visit.  Nature patient had necessary medications to get her till her next visit.  She will call the office and set up an appointment.

## 2021-02-05 ENCOUNTER — Telehealth (INDEPENDENT_AMBULATORY_CARE_PROVIDER_SITE_OTHER): Payer: BC Managed Care – PPO | Admitting: Physician Assistant

## 2021-02-05 ENCOUNTER — Encounter: Payer: Self-pay | Admitting: Physician Assistant

## 2021-02-05 DIAGNOSIS — F3170 Bipolar disorder, currently in remission, most recent episode unspecified: Secondary | ICD-10-CM

## 2021-02-05 DIAGNOSIS — F902 Attention-deficit hyperactivity disorder, combined type: Secondary | ICD-10-CM

## 2021-02-05 DIAGNOSIS — E559 Vitamin D deficiency, unspecified: Secondary | ICD-10-CM | POA: Diagnosis not present

## 2021-02-05 DIAGNOSIS — F4310 Post-traumatic stress disorder, unspecified: Secondary | ICD-10-CM | POA: Diagnosis not present

## 2021-02-05 MED ORDER — VITAMIN D (ERGOCALCIFEROL) 1.25 MG (50000 UNIT) PO CAPS: 50000.0000 [IU] | ORAL_CAPSULE | ORAL | 0 refills | Status: DC

## 2021-02-05 NOTE — Progress Notes (Signed)
[facility]  Patient ID: [name],  MRN: [id number]  PCP: Creola Corn, MD  Date of Evaluation: 02/05/2021 Time spent:30 minutes  Chief Complaint:  Chief Complaint   ADHD; Depression; Follow-up    Virtual Visit via Telehealth  I connected with patient by a video enabled telemedicine application with their informed consent, and verified patient privacy and that I am speaking with the correct person using two identifiers.  I am private, in my office and the patient is at work.  I discussed the limitations, risks, security and privacy concerns of performing an evaluation and management service by video and the availability of in person appointments. I also discussed with the patient that there may be a patient responsible charge related to this service. The patient expressed understanding and agreed to proceed.   I discussed the assessment and treatment plan with the patient. The patient was provided an opportunity to ask questions and all were answered. The patient agreed with the plan and demonstrated an understanding of the instructions.   The patient was advised to call back or seek an in-person evaluation if the symptoms worsen or if the condition fails to improve as anticipated.  I provided  30  minutes of non-face-to-face time during this encounter.   HISTORY/CURRENT STATUS: For routine f/u.  Doing really well. Patient denies loss of interest in usual activities and is able to enjoy things. 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  Denies decreased energy or motivation.  Appetite has not changed. Does eat less b/c recent gastic bypass surgery. No extreme sadness, tearfulness, or feelings of hopelessness.  Denies any changes in concentration, making decisions or remembering things.  She sleeps well.  Rarely takes the Seroquel for sleep and when she does it usually just a "pinched" off of it.  Denies suicidal or homicidal  thoughts.  States that attention is good without easy distractibility.  Able to focus on things and finish tasks to completion.   Patient denies increased energy with decreased need for sleep, no increased talkativeness, no racing thoughts, no impulsivity or risky behaviors, no increased spending, no increased libido, no grandiosity, no increased irritability or anger, and no hallucinations.  Denies dizziness, syncope, seizures, numbness, tingling, tremor, tics, unsteady gait, slurred speech, confusion. Denies muscle or joint pain, stiffness, or dystonia.  Individual Medical History/ Review of Systems: Changes? :Yes   had gastric bypass surgery revision, 12/28/2020. Has lost about 18 #. Also had labs not ago before her surgery.   Past medications for mental health diagnoses include: Adderall, Lamictal, Seroquel, Cymbalta, Tegretol, Buspar, Lexapro, Zyprexa   Allergies: Zyprexa [olanzapine]  Current Medications:  Current Outpatient Medications:    [START ON 03/10/2021] amphetamine-dextroamphetamine (ADDERALL) 30 MG tablet, Take 1 tablet by mouth 2 (two) times daily., Disp: 60 tablet, Rfl: 0   [START ON 02/08/2021] amphetamine-dextroamphetamine (ADDERALL) 30 MG tablet, Take 1 tablet by mouth 2 (two) times daily., Disp: 60 tablet, Rfl: 0   amphetamine-dextroamphetamine (ADDERALL) 30 MG tablet, Take 1 tablet by mouth 2 (two) times daily., Disp: 60 tablet, Rfl: 0   atenolol (TENORMIN) 25 MG tablet, Take 25 mg by mouth daily., Disp: , Rfl:    escitalopram (LEXAPRO) 20 MG tablet, Take 1 tablet (20 mg total) by mouth daily., Disp: 90 tablet, Rfl: 3   etonogestrel-ethinyl estradiol (NUVARING) 0.12-0.015 MG/24HR vaginal ring, Place 1 each vaginally every 28 (twenty-eight) days. Insert vaginally and leave in place for 3 consecutive weeks, then remove for 1 week., Disp: ,  Rfl:    ibuprofen (ADVIL,MOTRIN) 800 MG tablet, , Disp: , Rfl:    lamoTRIgine (LAMICTAL) 100 MG tablet, TAKE 1 EVERY AM AND 1.5 AT  BEDTIME, Disp: 225 tablet, Rfl: 3   Liraglutide -Weight Management 18 MG/3ML SOPN, Inject into the skin daily., Disp: , Rfl:    QUEtiapine (SEROQUEL) 100 MG tablet, TAKE 1 TABLET BY MOUTH EVERYDAY AT BEDTIME (Patient taking differently: at bedtime. prn), Disp: 90 tablet, Rfl: 0   Vitamin D, Ergocalciferol, (DRISDOL) 1.25 MG (50000 UNIT) CAPS capsule, Take 1 capsule (50,000 Units total) by mouth every 7 (seven) days., Disp: 12 capsule, Rfl: 0 Medication Side Effects: none  Family Medical/ Social History: Changes?  She and her husband are thinking about getting back together.  MENTAL HEALTH EXAM:  There were no vitals taken for this visit.There is no height or weight on file to calculate BMI.  General Appearance: Casual, Neat, and Well Groomed  Eye Contact:  Good  Speech:  Clear and Coherent and Normal Rate  Volume:  Normal  Mood:  Euthymic  Affect:  Appropriate  Thought Process:  Goal Directed and Descriptions of Associations: Intact  Orientation:  Full (Time, Place, and Person)  Thought Content: Logical   Suicidal Thoughts:  No  Homicidal Thoughts:  No  Memory:  WNL  Judgement:  Good  Insight:  Good  Psychomotor Activity:  Normal  Concentration:  Concentration: Good and Attention Span: Good  Recall:  Good  Fund of Knowledge: Good  Language: Good  Assets:  Desire for Improvement  ADL's:  Intact  Cognition: WNL  Prognosis:  Good   Labs reviewed from [facility] 12/18/2020. Only pertinent abnormal was vitamin D 14.3  DIAGNOSES:    ICD-10-CM   1. Bipolar disorder in remission (HCC)  F31.70     2. PTSD (post-traumatic stress disorder)  F43.10     3. Attention deficit hyperactivity disorder (ADHD), combined type  F90.2     4. Hypovitaminosis D  E55.9        Receiving Psychotherapy: Yes  [name], [facility]   RECOMMENDATIONS:  PDMD reviewed.  Last Adderall filled 01/10/2021. I provided 30 minutes of non-face-to-face time during this encounter,  including time spent before and after the visit in records review, medical decision making, counseling pertinent to today's visit, and charting.  Because Vit D level is below 50, I strongly recommend giving her high dose vitamin D.  Explained benefits of doing that.  She would like to try it. She is doing well with other medications so no changes will be made. Start Vit D 50,000 IU weekly.  Cont Adderall 30 mg bid. Cont Lexapro 20 mg qd. Cont Lamictal 100 mg po q am, and 150 mg qhs. Cont Seroquel 100 mg (she only takes a little bite off it, for sleep) Cont psychotherapy with [name], [facility]. She'll have PCP f/u within 1 to 2 months and will let him know about the vitamin D and ask him to follow up with lab work.  If she is not able to get it there, she will let me know and I will order it. Return in 3 months.  Melony Overly, PA-C

## 2021-03-11 ENCOUNTER — Telehealth: Payer: Self-pay | Admitting: Physician Assistant

## 2021-03-11 ENCOUNTER — Other Ambulatory Visit: Payer: Self-pay

## 2021-03-11 MED ORDER — AMPHETAMINE-DEXTROAMPHETAMINE 30 MG PO TABS: 30.0000 mg | ORAL_TABLET | Freq: Two times a day (BID) | ORAL | 0 refills | Status: DC

## 2021-03-11 NOTE — Telephone Encounter (Signed)
Pended.

## 2021-03-11 NOTE — Telephone Encounter (Signed)
Pt asked that Adderall refill be sent to [facility] on [facility] in [place].  She has confirmed availability there.  Next appt 3/14.

## 2021-03-18 DIAGNOSIS — E559 Vitamin D deficiency, unspecified: Secondary | ICD-10-CM | POA: Diagnosis not present

## 2021-03-18 DIAGNOSIS — E785 Hyperlipidemia, unspecified: Secondary | ICD-10-CM | POA: Diagnosis not present

## 2021-03-18 DIAGNOSIS — I10 Essential (primary) hypertension: Secondary | ICD-10-CM | POA: Diagnosis not present

## 2021-03-27 DIAGNOSIS — Z6827 Body mass index (BMI) 27.0-27.9, adult: Secondary | ICD-10-CM | POA: Diagnosis not present

## 2021-03-27 DIAGNOSIS — Z1231 Encounter for screening mammogram for malignant neoplasm of breast: Secondary | ICD-10-CM | POA: Diagnosis not present

## 2021-03-27 DIAGNOSIS — Z01419 Encounter for gynecological examination (general) (routine) without abnormal findings: Secondary | ICD-10-CM | POA: Diagnosis not present

## 2021-03-27 DIAGNOSIS — R87612 Low grade squamous intraepithelial lesion on cytologic smear of cervix (LGSIL): Secondary | ICD-10-CM | POA: Diagnosis not present

## 2021-04-03 DIAGNOSIS — N6002 Solitary cyst of left breast: Secondary | ICD-10-CM | POA: Diagnosis not present

## 2021-04-03 DIAGNOSIS — R922 Inconclusive mammogram: Secondary | ICD-10-CM | POA: Diagnosis not present

## 2021-04-09 ENCOUNTER — Telehealth: Payer: Self-pay | Admitting: Physician Assistant

## 2021-04-09 NOTE — Telephone Encounter (Signed)
Pt requested refill of Adderall and Vitamin D to  [facility] [address] [place], [place] [address] [phone]  Next appt 3/14

## 2021-04-10 ENCOUNTER — Other Ambulatory Visit: Payer: Self-pay

## 2021-04-10 ENCOUNTER — Other Ambulatory Visit: Payer: Self-pay | Admitting: Physician Assistant

## 2021-04-10 MED ORDER — AMPHETAMINE-DEXTROAMPHETAMINE 30 MG PO TABS: 30.0000 mg | ORAL_TABLET | Freq: Two times a day (BID) | ORAL | 0 refills | Status: DC

## 2021-04-10 MED ORDER — VITAMIN D (ERGOCALCIFEROL) 1.25 MG (50000 UNIT) PO CAPS: 50000.0000 [IU] | ORAL_CAPSULE | ORAL | 0 refills | Status: DC

## 2021-04-10 NOTE — Telephone Encounter (Signed)
Pended.

## 2021-04-11 NOTE — Telephone Encounter (Signed)
Please approve

## 2021-04-20 ENCOUNTER — Other Ambulatory Visit: Payer: Self-pay | Admitting: Physician Assistant

## 2021-04-20 DIAGNOSIS — F988 Other specified behavioral and emotional disorders with onset usually occurring in childhood and adolescence: Secondary | ICD-10-CM

## 2021-05-02 ENCOUNTER — Telehealth: Payer: Self-pay | Admitting: Physician Assistant

## 2021-05-02 NOTE — Telephone Encounter (Signed)
Pt brought in FMLA forms to complete and fax. Placed in Traci's box. ?

## 2021-05-07 ENCOUNTER — Encounter: Payer: Self-pay | Admitting: Physician Assistant

## 2021-05-07 ENCOUNTER — Other Ambulatory Visit: Payer: Self-pay

## 2021-05-07 ENCOUNTER — Ambulatory Visit: Payer: BC Managed Care – PPO | Admitting: Physician Assistant

## 2021-05-07 DIAGNOSIS — E559 Vitamin D deficiency, unspecified: Secondary | ICD-10-CM

## 2021-05-07 DIAGNOSIS — F3170 Bipolar disorder, currently in remission, most recent episode unspecified: Secondary | ICD-10-CM

## 2021-05-07 DIAGNOSIS — F902 Attention-deficit hyperactivity disorder, combined type: Secondary | ICD-10-CM

## 2021-05-07 MED ORDER — AMPHETAMINE-DEXTROAMPHETAMINE 30 MG PO TABS: 30.0000 mg | ORAL_TABLET | Freq: Two times a day (BID) | ORAL | 0 refills | Status: DC

## 2021-05-07 MED ORDER — VITAMIN D (ERGOCALCIFEROL) 1.25 MG (50000 UNIT) PO CAPS: 50000.0000 [IU] | ORAL_CAPSULE | ORAL | 0 refills | Status: AC

## 2021-05-07 NOTE — Telephone Encounter (Signed)
Paper work completed and given to Teresa for review and signature ?

## 2021-05-07 NOTE — Progress Notes (Signed)
[facility] ? ?Patient ID: [name],  ?MRN: [id number] ? ?PCP: Creola Corn, MD ? ?Date of Evaluation: 05/07/2021 ?Time spent:30 minutes ? ?Chief Complaint:  ?Chief Complaint   ?Depression; ADHD; Follow-up ?  ? ? ?HISTORY/CURRENT STATUS: ?For routine f/u. ? ?She is doing well.  We started high-dose weekly vitamin D back in the fall and she can tell she has more energy and her mood seems more stable if that is possible.  She has responded very well to her current medications, is able to enjoy things.  She is not isolating.  She is moving back in with her husband next month, after 3 years of them living apart.  A little nervous but excited about it as well.  She sleeps good.  Rarely takes a "bite" off the Seroquel to help her sleep.  ADLs and personal hygiene are normal.  Work is going well although she does need to take time off to go to therapy appointments once a month and also other doctor's appointments occasionally.  No suicidal or homicidal thoughts reported. ? ?States that attention is good without easy distractibility.  Able to focus on things and finish tasks to completion.  ? ?Patient denies increased energy with decreased need for sleep, no increased talkativeness, no racing thoughts, no impulsivity or risky behaviors, no increased spending, no increased libido, no grandiosity, no increased irritability or anger, and no hallucinations. ? ?Denies dizziness, syncope, seizures, numbness, tingling, tremor, tics, unsteady gait, slurred speech, confusion. Denies muscle or joint pain, stiffness, or dystonia. ? ?Individual Medical History/ Review of Systems: Changes? :No    ? ?Past medications for mental health diagnoses include: ?Adderall, Lamictal, Seroquel, Cymbalta, Tegretol, Buspar, Lexapro, Zyprexa  ? ?Allergies: Zyprexa [olanzapine] ? ?Current Medications:  ?Current Outpatient Medications:  ?  amphetamine-dextroamphetamine (ADDERALL) 30 MG tablet, Take 1 tablet by mouth 2 (two) times  daily., Disp: 60 tablet, Rfl: 0 ?  amphetamine-dextroamphetamine (ADDERALL) 30 MG tablet, Take 1 tablet by mouth 2 (two) times daily., Disp: 60 tablet, Rfl: 0 ?  atenolol (TENORMIN) 25 MG tablet, Take 25 mg by mouth daily., Disp: , Rfl:  ?  escitalopram (LEXAPRO) 20 MG tablet, Take 1 tablet (20 mg total) by mouth daily., Disp: 90 tablet, Rfl: 3 ?  etonogestrel-ethinyl estradiol (NUVARING) 0.12-0.015 MG/24HR vaginal ring, Place 1 each vaginally every 28 (twenty-eight) days. Insert vaginally and leave in place for 3 consecutive weeks, then remove for 1 week., Disp: , Rfl:  ?  ibuprofen (ADVIL,MOTRIN) 800 MG tablet, , Disp: , Rfl:  ?  lamoTRIgine (LAMICTAL) 100 MG tablet, TAKE 1 EVERY AM AND 1.5 AT BEDTIME, Disp: 225 tablet, Rfl: 3 ?  QUEtiapine (SEROQUEL) 100 MG tablet, TAKE 1 TABLET BY MOUTH EVERYDAY AT BEDTIME, Disp: 30 tablet, Rfl: 0 ?  amphetamine-dextroamphetamine (ADDERALL) 30 MG tablet, Take 1 tablet by mouth 2 (two) times daily., Disp: 60 tablet, Rfl: 0 ?  Liraglutide -Weight Management 18 MG/3ML SOPN, Inject into the skin daily. (Patient not taking: Reported on 05/07/2021), Disp: , Rfl:  ?  Vitamin D, Ergocalciferol, (DRISDOL) 1.25 MG (50000 UNIT) CAPS capsule, Take 1 capsule (50,000 Units total) by mouth every 7 (seven) days., Disp: 12 capsule, Rfl: 0 ?Medication Side Effects: none ? ?Family Medical/ Social History: Changes?  She and her husband are thinking about getting back together. ? ?MENTAL HEALTH EXAM: ? ?There were no vitals taken for this visit.There is no height or weight on file to calculate BMI.  ?General Appearance: Casual, Neat, and Well Groomed  ?  Eye Contact:  Good  ?Speech:  Clear and Coherent and Normal Rate  ?Volume:  Normal  ?Mood:  Euthymic  ?Affect:  Appropriate  ?Thought Process:  Goal Directed and Descriptions of Associations: Circumstantial  ?Orientation:  Full (Time, Place, and Person)  ?Thought Content: Logical   ?Suicidal Thoughts:  No  ?Homicidal Thoughts:  No  ?Memory:  WNL   ?Judgement:  Good  ?Insight:  Good  ?Psychomotor Activity:  Normal  ?Concentration:  Concentration: Good and Attention Span: Good  ?Recall:  Good  ?Fund of Knowledge: Good  ?Language: Good  ?Assets:  Desire for Improvement  ?ADL's:  Intact  ?Cognition: WNL  ?Prognosis:  Good  ? ?Labs from [facility] on 03/18/2021 ?BMP glucose 80 BUN 11, creatinine 0.7, sodium 141, potassium 4.2, chloride and CO2 normal calcium at 9.6 ?Vit D 70.3 (from 14.3 on 12/18/2020) ? ?DIAGNOSES:  ?  ICD-10-CM   ?1. Bipolar disorder in remission (HCC)  F31.70   ?  ?2. Attention deficit hyperactivity disorder (ADHD), combined type  F90.2   ?  ?3. Hypovitaminosis D  E55.9   ?  ? ? ? ? ?Receiving Psychotherapy: Yes  [name], [facility] ? ? ?RECOMMENDATIONS:  ?PDMD reviewed.  Last Adderall filled 04/10/2021. ?I provided 30 minutes of face to face time during this encounter, including time spent before and after the visit in records review, medical decision making, counseling pertinent to today's visit, and charting.  ?I am glad to see her doing so well!  No changes in meds are necessary. ?Her vitamin D is well within normal range now.  She will be seeing her PCP for routine physical in May and I have asked her to have the level repeated.  For now she will continue the weekly dose of D but if the level is over 90 then I will stop the weekly and have her take daily OTC dose. ? ?Continue Vit D 50,000 IU weekly.  ?Cont Adderall 30 mg bid. ?Cont Lexapro 20 mg qd. ?Cont Lamictal 100 mg po q am, and 150 mg qhs. ?Cont Seroquel 100 mg (she only takes a little bite off it, for sleep) Rarely takes.  ?Cont psychotherapy with [name], [facility]. ?Return in 6 months. ? ?Melony Overly, PA-C  ? ? ? ?

## 2021-05-08 DIAGNOSIS — Z0289 Encounter for other administrative examinations: Secondary | ICD-10-CM

## 2021-05-17 ENCOUNTER — Other Ambulatory Visit: Payer: Self-pay | Admitting: Physician Assistant

## 2021-05-17 DIAGNOSIS — F988 Other specified behavioral and emotional disorders with onset usually occurring in childhood and adolescence: Secondary | ICD-10-CM

## 2021-06-07 ENCOUNTER — Telehealth: Payer: Self-pay | Admitting: Physician Assistant

## 2021-06-07 ENCOUNTER — Other Ambulatory Visit: Payer: Self-pay | Admitting: Physician Assistant

## 2021-06-07 MED ORDER — AMPHETAMINE-DEXTROAMPHETAMINE 30 MG PO TABS: 30.0000 mg | ORAL_TABLET | Freq: Two times a day (BID) | ORAL | 0 refills | Status: DC

## 2021-06-07 NOTE — Telephone Encounter (Signed)
Next visit is 11/08/21. [name] is requesting a refill on her Adderall 30 mg called to: ? ?[id number] [place], [place] - [address] ? ?Phone:  [phone]  ?Fax:  [phone]  ? ?She checked with pharmacy and they have this in stock.  ? ? ? ? ?

## 2021-06-07 NOTE — Telephone Encounter (Signed)
Prescription was sent

## 2021-06-15 ENCOUNTER — Other Ambulatory Visit: Payer: Self-pay | Admitting: Physician Assistant

## 2021-06-15 DIAGNOSIS — F988 Other specified behavioral and emotional disorders with onset usually occurring in childhood and adolescence: Secondary | ICD-10-CM

## 2021-07-03 DIAGNOSIS — E559 Vitamin D deficiency, unspecified: Secondary | ICD-10-CM | POA: Diagnosis not present

## 2021-07-03 DIAGNOSIS — E785 Hyperlipidemia, unspecified: Secondary | ICD-10-CM | POA: Diagnosis not present

## 2021-07-03 DIAGNOSIS — E538 Deficiency of other specified B group vitamins: Secondary | ICD-10-CM | POA: Diagnosis not present

## 2021-07-08 ENCOUNTER — Telehealth: Payer: Self-pay | Admitting: Physician Assistant

## 2021-07-08 ENCOUNTER — Other Ambulatory Visit: Payer: Self-pay | Admitting: Physician Assistant

## 2021-07-08 MED ORDER — AMPHETAMINE-DEXTROAMPHETAMINE 30 MG PO TABS: 30.0000 mg | ORAL_TABLET | Freq: Two times a day (BID) | ORAL | 0 refills | Status: DC

## 2021-07-08 NOTE — Telephone Encounter (Signed)
Prescription sent

## 2021-07-08 NOTE — Telephone Encounter (Signed)
Patient called in for refill on Adderall 30mg . Ph: [phone]. Appt 9/15 [facility] [id number] [place] ?

## 2021-07-11 ENCOUNTER — Other Ambulatory Visit: Payer: Self-pay | Admitting: Physician Assistant

## 2021-07-11 DIAGNOSIS — F988 Other specified behavioral and emotional disorders with onset usually occurring in childhood and adolescence: Secondary | ICD-10-CM

## 2021-07-11 DIAGNOSIS — I10 Essential (primary) hypertension: Secondary | ICD-10-CM | POA: Diagnosis not present

## 2021-07-11 DIAGNOSIS — R82998 Other abnormal findings in urine: Secondary | ICD-10-CM | POA: Diagnosis not present

## 2021-07-11 DIAGNOSIS — Z1331 Encounter for screening for depression: Secondary | ICD-10-CM | POA: Diagnosis not present

## 2021-07-11 DIAGNOSIS — Z0000 Encounter for general adult medical examination without abnormal findings: Secondary | ICD-10-CM | POA: Diagnosis not present

## 2021-08-07 ENCOUNTER — Other Ambulatory Visit: Payer: Self-pay

## 2021-08-07 ENCOUNTER — Telehealth: Payer: Self-pay | Admitting: Physician Assistant

## 2021-08-07 MED ORDER — AMPHETAMINE-DEXTROAMPHETAMINE 30 MG PO TABS: 30.0000 mg | ORAL_TABLET | Freq: Two times a day (BID) | ORAL | 0 refills | Status: DC

## 2021-08-07 NOTE — Telephone Encounter (Signed)
Pended.

## 2021-08-07 NOTE — Telephone Encounter (Signed)
Next appt is 11/08/21. Requesting a refill on Adderall 30 mg called to:  [id number] [place], [place] - [address]  Phone:  [phone]  Fax:  [phone]

## 2021-09-04 ENCOUNTER — Other Ambulatory Visit: Payer: Self-pay

## 2021-09-04 ENCOUNTER — Telehealth: Payer: Self-pay | Admitting: Physician Assistant

## 2021-09-04 MED ORDER — AMPHETAMINE-DEXTROAMPHETAMINE 30 MG PO TABS
30.0000 mg | ORAL_TABLET | Freq: Two times a day (BID) | ORAL | 0 refills | Status: DC
Start: 2021-09-05 — End: 2021-09-05

## 2021-09-04 NOTE — Telephone Encounter (Signed)
Patient called in for refill on Adderall 30mg . Ph: [phone] Appt 9/15 [facility] [address] [place], [place]

## 2021-09-04 NOTE — Telephone Encounter (Signed)
Pended.

## 2021-09-05 ENCOUNTER — Telehealth: Payer: Self-pay | Admitting: Physician Assistant

## 2021-09-05 ENCOUNTER — Other Ambulatory Visit: Payer: Self-pay | Admitting: Physician Assistant

## 2021-09-05 ENCOUNTER — Other Ambulatory Visit: Payer: Self-pay

## 2021-09-05 MED ORDER — AMPHETAMINE-DEXTROAMPHETAMINE 30 MG PO TABS: 30.0000 mg | ORAL_TABLET | Freq: Two times a day (BID) | ORAL | 0 refills | Status: DC

## 2021-09-05 NOTE — Telephone Encounter (Signed)
[facility]. Requesting to send to [facility]

## 2021-09-05 NOTE — Telephone Encounter (Signed)
Pended.

## 2021-10-07 ENCOUNTER — Other Ambulatory Visit: Payer: Self-pay

## 2021-10-07 ENCOUNTER — Telehealth: Payer: Self-pay | Admitting: Physician Assistant

## 2021-10-07 MED ORDER — AMPHETAMINE-DEXTROAMPHETAMINE 30 MG PO TABS
30.0000 mg | ORAL_TABLET | Freq: Two times a day (BID) | ORAL | 0 refills | Status: DC
Start: 2021-10-07 — End: 2021-11-08

## 2021-10-07 NOTE — Telephone Encounter (Signed)
Pended.

## 2021-10-07 NOTE — Telephone Encounter (Signed)
[name] called this morning at 11:36 to request refill of her Adderall.  Appt 9/15.  [facility] [address] [place], [place]

## 2021-11-02 ENCOUNTER — Other Ambulatory Visit: Payer: Self-pay | Admitting: Physician Assistant

## 2021-11-02 DIAGNOSIS — F3170 Bipolar disorder, currently in remission, most recent episode unspecified: Secondary | ICD-10-CM

## 2021-11-08 ENCOUNTER — Other Ambulatory Visit: Payer: Self-pay

## 2021-11-08 ENCOUNTER — Ambulatory Visit: Payer: BC Managed Care – PPO | Admitting: Physician Assistant

## 2021-11-08 ENCOUNTER — Telehealth: Payer: Self-pay | Admitting: Physician Assistant

## 2021-11-08 ENCOUNTER — Encounter: Payer: Self-pay | Admitting: Physician Assistant

## 2021-11-08 DIAGNOSIS — F902 Attention-deficit hyperactivity disorder, combined type: Secondary | ICD-10-CM | POA: Diagnosis not present

## 2021-11-08 DIAGNOSIS — F4321 Adjustment disorder with depressed mood: Secondary | ICD-10-CM

## 2021-11-08 DIAGNOSIS — E559 Vitamin D deficiency, unspecified: Secondary | ICD-10-CM | POA: Diagnosis not present

## 2021-11-08 DIAGNOSIS — F3170 Bipolar disorder, currently in remission, most recent episode unspecified: Secondary | ICD-10-CM

## 2021-11-08 MED ORDER — AMPHETAMINE-DEXTROAMPHETAMINE 30 MG PO TABS: 30.0000 mg | ORAL_TABLET | Freq: Two times a day (BID) | ORAL | 0 refills | Status: DC

## 2021-11-08 MED ORDER — LAMOTRIGINE 100 MG PO TABS: 150.0000 mg | ORAL_TABLET | Freq: Two times a day (BID) | ORAL | 3 refills | Status: DC

## 2021-11-08 NOTE — Progress Notes (Signed)
[facility]  Patient ID: [name],  MRN: [id number]  PCP: Creola Corn, MD  Date of Evaluation: 11/08/2021 Time spent:20 minutes  Chief Complaint:  Chief Complaint   ADHD; Depression; Follow-up    HISTORY/CURRENT STATUS: For routine f/u.  Very sad about the death of her 42 yo dog in 2022-09-04. Cries a lot but feels like that is normal under the circumstances.  Wonders if we need to increase one of the medicines though.  She is able to enjoy things but has to push herself to do so.  Energy and motivation are good most of the time.  She is on Wegovy so appetite is decreased and she has lost weight on purpose.  Not using laxatives, not binging or purging.  ADLs and personal hygiene are normal.  Work is going well.  She and her husband are back together again.  No complaints of anxiety.  She sleeps okay most of the time.  She does occasionally use Seroquel to help her sleep.  Denies suicidal or homicidal thoughts.  Patient denies increased energy with decreased need for sleep, increased talkativeness, racing thoughts, impulsivity or risky behaviors, increased spending, increased libido, grandiosity, increased irritability or anger, paranoia, or hallucinations.  Denies dizziness, syncope, seizures, numbness, tingling, tremor, tics, unsteady gait, slurred speech, confusion. Denies muscle or joint pain, stiffness, or dystonia.  Individual Medical History/ Review of Systems: Changes? :No     Past medications for mental health diagnoses include: Adderall, Lamictal, Seroquel, Cymbalta, Tegretol, Buspar, Lexapro, Zyprexa   Allergies: Zyprexa [olanzapine]  Current Medications:  Current Outpatient Medications:    atenolol (TENORMIN) 25 MG tablet, Take 25 mg by mouth daily., Disp: , Rfl:    escitalopram (LEXAPRO) 20 MG tablet, TAKE 1 TABLET BY MOUTH EVERY DAY, Disp: 90 tablet, Rfl: 0   etonogestrel-ethinyl estradiol (NUVARING) 0.12-0.015 MG/24HR vaginal ring, Place 1 each  vaginally every 28 (twenty-eight) days. Insert vaginally and leave in place for 3 consecutive weeks, then remove for 1 week., Disp: , Rfl:    ibuprofen (ADVIL,MOTRIN) 800 MG tablet, , Disp: , Rfl:    QUEtiapine (SEROQUEL) 100 MG tablet, TAKE 1 TABLET BY MOUTH EVERYDAY AT BEDTIME (Patient taking differently: TAKE 1 TABLET BY MOUTH EVERYDAY AT BEDTIME prn), Disp: 90 tablet, Rfl: 1   Vitamin D, Ergocalciferol, (DRISDOL) 1.25 MG (50000 UNIT) CAPS capsule, Take 1 capsule (50,000 Units total) by mouth every 7 (seven) days., Disp: 12 capsule, Rfl: 0   WEGOVY 1.7 MG/0.75ML SOAJ, SMARTSIG:1.7 Milligram(s) SUB-Q Once a Week, Disp: , Rfl:    [START ON 01/06/2022] amphetamine-dextroamphetamine (ADDERALL) 30 MG tablet, Take 1 tablet by mouth 2 (two) times daily., Disp: 60 tablet, Rfl: 0   [START ON 12/07/2021] amphetamine-dextroamphetamine (ADDERALL) 30 MG tablet, Take 1 tablet by mouth 2 (two) times daily., Disp: 60 tablet, Rfl: 0   amphetamine-dextroamphetamine (ADDERALL) 30 MG tablet, Take 1 tablet by mouth 2 (two) times daily., Disp: 60 tablet, Rfl: 0   lamoTRIgine (LAMICTAL) 100 MG tablet, Take 1.5 tablets (150 mg total) by mouth 2 (two) times daily., Disp: 270 tablet, Rfl: 3   Liraglutide -Weight Management 18 MG/3ML SOPN, Inject into the skin daily. (Patient not taking: Reported on 05/07/2021), Disp: , Rfl:  Medication Side Effects: none  Family Medical/ Social History: Changes? See HPI.  MENTAL HEALTH EXAM:  There were no vitals taken for this visit.There is no height or weight on file to calculate BMI.  General Appearance: Casual, Neat, and Well Groomed  Eye Contact:  Good  Speech:  Clear and Coherent and Normal Rate  Volume:  Normal  Mood:   Sad when talking about losing her dog  Affect:  Congruent and Tearful  Thought Process:  Goal Directed and Descriptions of Associations: Circumstantial  Orientation:  Full (Time, Place, and Person)  Thought Content: Logical   Suicidal Thoughts:  No   Homicidal Thoughts:  No  Memory:  WNL  Judgement:  Good  Insight:  Good  Psychomotor Activity:  Normal  Concentration:  Concentration: Good and Attention Span: Good  Recall:  Good  Fund of Knowledge: Good  Language: Good  Assets:  Desire for Improvement  ADL's:  Intact  Cognition: WNL  Prognosis:  Good   Labs from [facility] on 03/18/2021 BMP glucose 80 BUN 11, creatinine 0.7, sodium 141, potassium 4.2, chloride and CO2 normal calcium at 9.6 Vit D 70.3 (from 14.3 on 12/18/2020)  DIAGNOSES:    ICD-10-CM   1. Bipolar disorder in remission (HCC)  F31.70     2. Grief  F43.21     3. Attention deficit hyperactivity disorder (ADHD), combined type  F90.2     4. Hypovitaminosis D  E55.9       Receiving Psychotherapy: Yes  [name], [facility]  RECOMMENDATIONS:  PDMD reviewed.  Last Adderall filled 10/07/2021. I provided 20 minutes of face to face time during this encounter, including time spent before and after the visit in records review, medical decision making, counseling pertinent to today's visit, and charting.   My condolences in the loss of her dog. I recommend increasing Lamictal.  She has been on the current dose for a while.  She would like to increase it.  Cont Adderall 30 mg bid. Cont Lexapro 20 mg qd. Increase to Lamictal 150 mg po q am, and 150 mg qhs. Cont Seroquel 100 mg (she only takes a little bite off it, for sleep) Rarely takes.  Continue vitamin D 50,000 IUs weekly. Cont psychotherapy with [name], [facility]. Return in 6 weeks.  Melony Overly, PA-C

## 2021-11-08 NOTE — Telephone Encounter (Signed)
Next visit is 12/20/21. [facility] Please send script to   [facility] - [place], [place] - [address]  Phone:  [phone]  Fax:  [phone]

## 2021-11-08 NOTE — Telephone Encounter (Signed)
Pended.

## 2021-12-16 ENCOUNTER — Telehealth: Payer: Self-pay | Admitting: Physician Assistant

## 2021-12-16 NOTE — Telephone Encounter (Signed)
No, I need to see her first.  Have her bring FMLA forms to that appointment.  Thanks.

## 2021-12-16 NOTE — Telephone Encounter (Signed)
Patient notified

## 2021-12-16 NOTE — Telephone Encounter (Signed)
Called patient in regards to the message she left about short-term disability.  She states that there is a lot going on; she has moved back home, new job, and medication changes.  She is crying by the end of the conversation. She has an appt with you later this week, but wondered if you thought this was appropriate and she could initiate any paperwork now.

## 2021-12-16 NOTE — Telephone Encounter (Signed)
Dawn called this morning at 11:25 wanting to discuss short term disability.  She has an appt 10/27 but wanted your thoughts on disability and could she go ahead and get the process start before her appt.  Please call to discuss.

## 2021-12-19 ENCOUNTER — Other Ambulatory Visit: Payer: Self-pay | Admitting: Physician Assistant

## 2021-12-20 ENCOUNTER — Telehealth: Payer: Self-pay | Admitting: Physician Assistant

## 2021-12-20 ENCOUNTER — Ambulatory Visit: Payer: BC Managed Care – PPO | Admitting: Physician Assistant

## 2021-12-20 ENCOUNTER — Encounter: Payer: Self-pay | Admitting: Physician Assistant

## 2021-12-20 DIAGNOSIS — F4323 Adjustment disorder with mixed anxiety and depressed mood: Secondary | ICD-10-CM

## 2021-12-20 DIAGNOSIS — F319 Bipolar disorder, unspecified: Secondary | ICD-10-CM | POA: Diagnosis not present

## 2021-12-20 DIAGNOSIS — F4321 Adjustment disorder with depressed mood: Secondary | ICD-10-CM | POA: Diagnosis not present

## 2021-12-20 DIAGNOSIS — F902 Attention-deficit hyperactivity disorder, combined type: Secondary | ICD-10-CM | POA: Diagnosis not present

## 2021-12-20 DIAGNOSIS — F4310 Post-traumatic stress disorder, unspecified: Secondary | ICD-10-CM

## 2021-12-20 MED ORDER — ESCITALOPRAM OXALATE 20 MG PO TABS: 20.0000 mg | ORAL_TABLET | Freq: Every day | ORAL | 1 refills | Status: DC

## 2021-12-20 MED ORDER — LAMOTRIGINE 100 MG PO TABS: 200.0000 mg | ORAL_TABLET | Freq: Two times a day (BID) | ORAL | 0 refills | Status: DC

## 2021-12-20 NOTE — Progress Notes (Signed)
[facility]  Patient ID: [name],  MRN: [id number]  PCP: Shon Baton, MD  Date of Evaluation: 12/20/2021 Time spent:20 minutes  Chief Complaint:  Chief Complaint   Anxiety; Depression; Insomnia; Follow-up    HISTORY/CURRENT STATUS: For routine f/u.  Very upset. Doesn't think she'll ever get over losing her dog [name] who died in 2022/09/08. She's second guessing her decision of moving back in with her husband several months ago. He wanted her back but now isn't compassionate. She's struggling with that. Also job responsibilities have changed and that's stressed out.   Still couponing, energy and motivation are fair, not isolating, cries easily, Work is very stressful and having a hard time with that, not it is hard for her to even do certain ADLs, she really feels like sitting around, appetite is normal and weight is stable.  She is exercising.  No suicidal or homicidal thoughts.  She has started taking the Seroquel more often now or else she cannot relax to go to sleep.  She does get anxious, no panic attacks but just overwhelmed and cannot calm down sometimes.  It is more so due to grief of losing her dog this past summer, she blames herself for his death.  Patient denies increased energy with decreased need for sleep, increased talkativeness, racing thoughts, impulsivity or risky behaviors, increased spending, increased libido, grandiosity, increased irritability or anger, paranoia, or hallucinations.  Denies dizziness, syncope, seizures, numbness, tingling, tremor, tics, unsteady gait, slurred speech, confusion. Denies muscle or joint pain, stiffness, or dystonia.  Individual Medical History/ Review of Systems: Changes? :No     Past medications for mental health diagnoses include: Adderall, Lamictal, Seroquel, Cymbalta, Tegretol, Buspar, Lexapro, Zyprexa   Allergies: Zyprexa [olanzapine]  Current Medications:  Current Outpatient Medications:     amphetamine-dextroamphetamine (ADDERALL) 30 MG tablet, Take 1 tablet by mouth 2 (two) times daily., Disp: 60 tablet, Rfl: 0   amphetamine-dextroamphetamine (ADDERALL) 30 MG tablet, Take 1 tablet by mouth 2 (two) times daily., Disp: 60 tablet, Rfl: 0   amphetamine-dextroamphetamine (ADDERALL) 30 MG tablet, Take 1 tablet by mouth 2 (two) times daily., Disp: 60 tablet, Rfl: 0   atenolol (TENORMIN) 25 MG tablet, Take 25 mg by mouth daily., Disp: , Rfl:    etonogestrel-ethinyl estradiol (NUVARING) 0.12-0.015 MG/24HR vaginal ring, Place 1 each vaginally every 28 (twenty-eight) days. Insert vaginally and leave in place for 3 consecutive weeks, then remove for 1 week., Disp: , Rfl:    ibuprofen (ADVIL,MOTRIN) 800 MG tablet, , Disp: , Rfl:    QUEtiapine (SEROQUEL) 100 MG tablet, TAKE 1 TABLET BY MOUTH EVERYDAY AT BEDTIME (Patient taking differently: TAKE 1 TABLET BY MOUTH EVERYDAY AT BEDTIME prn), Disp: 90 tablet, Rfl: 1   Vitamin D, Ergocalciferol, (DRISDOL) 1.25 MG (50000 UNIT) CAPS capsule, Take 1 capsule (50,000 Units total) by mouth every 7 (seven) days., Disp: 12 capsule, Rfl: 0   WEGOVY 1.7 MG/0.75ML SOAJ, SMARTSIG:1.7 Milligram(s) SUB-Q Once a Week, Disp: , Rfl:    escitalopram (LEXAPRO) 20 MG tablet, Take 1 tablet (20 mg total) by mouth daily., Disp: 90 tablet, Rfl: 1   lamoTRIgine (LAMICTAL) 100 MG tablet, Take 2 tablets (200 mg total) by mouth 2 (two) times daily., Disp: 225 tablet, Rfl: 0   Liraglutide -Weight Management 18 MG/3ML SOPN, Inject into the skin daily. (Patient not taking: Reported on 05/07/2021), Disp: , Rfl:  Medication Side Effects: none  Family Medical/ Social History: Changes? See HPI.  MENTAL HEALTH EXAM:  There were no vitals  taken for this visit.There is no height or weight on file to calculate BMI.  General Appearance: Casual, Neat, and Well Groomed  Eye Contact:  Good  Speech:  Clear and Coherent and Normal Rate  Volume:  Normal  Mood:  Depressed and Hopeless  Affect:   Depressed, Tearful, and almost unconsolable but did stop crying in 10-15 minutes  Thought Process:  Goal Directed and Descriptions of Associations: Circumstantial  Orientation:  Full (Time, Place, and Person)  Thought Content: Logical   Suicidal Thoughts:  No  Homicidal Thoughts:  No  Memory:  WNL  Judgement:  Good  Insight:  Good  Psychomotor Activity:  Normal  Concentration:  Concentration: Good and Attention Span: Good  Recall:  Good  Fund of Knowledge: Good  Language: Good  Assets:  Desire for Improvement  ADL's:  Intact  Cognition: WNL  Prognosis:  Good   DIAGNOSES:    ICD-10-CM   1. Situational mixed anxiety and depressive disorder  F43.23     2. Bipolar affective disorder, remission status unspecified (HCC)  F31.9 escitalopram (LEXAPRO) 20 MG tablet    3. Grief  F43.21     4. Attention deficit hyperactivity disorder (ADHD), combined type  F90.2     5. PTSD (post-traumatic stress disorder)  F43.10      Receiving Psychotherapy: Yes  [name], [facility]  RECOMMENDATIONS:  PDMD reviewed.  Last Adderall filled 12/07/2021. I provided 30 minutes of face to face time during this encounter, including time spent before and after the visit in records review, medical decision making, counseling pertinent to today's visit, and charting.   Needs some time off work, mentally she is not capable of staying on task, keeping her thoughts straight.  I will write her out, returning to work on Mar 03, 2022.  In the meantime we will make some medication changes and she will get back in counseling more frequently.  I also strongly recommend marriage counseling.  Cont Adderall 30 mg bid. Cont Lexapro 20 mg qd. Increase Lamictal to 200 mg, 1 po bid.  Cont Seroquel 100 mg (she only takes a little bite off it, for sleep) Rarely takes.  Continue vitamin D 50,000 IUs weekly. Cont psychotherapy with [name], [facility]. Return in 4 weeks.  Melony Overly, PA-C

## 2021-12-20 NOTE — Telephone Encounter (Signed)
Received Health Care Provider Statement paperwork. Given to [facility] 10/27

## 2022-01-01 NOTE — Telephone Encounter (Signed)
Patient is asking about the status of her FMLA paperwork due 11/14. I don't see anything scanned in recently and there isn't anything in the accordion folder.

## 2022-01-01 NOTE — Telephone Encounter (Signed)
Adderall RF due 11/12

## 2022-01-01 NOTE — Telephone Encounter (Signed)
[name] call this morning checking status of her FMLA paperwork.  She said it is Due 11/14.    She also needs refill of her Adderall.  Appt 11/28. [facility] [place]

## 2022-01-02 NOTE — Telephone Encounter (Signed)
Noted yes paper work received due 01/07/2022

## 2022-01-03 ENCOUNTER — Other Ambulatory Visit: Payer: Self-pay

## 2022-01-03 MED ORDER — AMPHETAMINE-DEXTROAMPHETAMINE 30 MG PO TABS: 30.0000 mg | ORAL_TABLET | Freq: Two times a day (BID) | ORAL | 0 refills | Status: DC

## 2022-01-03 NOTE — Telephone Encounter (Signed)
Pended.

## 2022-01-05 NOTE — Telephone Encounter (Signed)
Paper work completed. Given to [place] to review and sign.

## 2022-01-07 DIAGNOSIS — Z0289 Encounter for other administrative examinations: Secondary | ICD-10-CM

## 2022-01-08 NOTE — Telephone Encounter (Signed)
Obie called this morning at 9:10 to check status of her paperwork.  It was do yesterday.  This shows it is completed and ready to sign.  She needs to know when it is sent in because she is now using her vacation time to cover her being out.  Again, please let herr know when it has been sent so she can follow up with her employer.

## 2022-01-08 NOTE — Telephone Encounter (Signed)
Forwarding to you Traci, I signed it yesterday and put on your desk. It was after hours though. Thanks.

## 2022-01-14 DIAGNOSIS — Z1331 Encounter for screening for depression: Secondary | ICD-10-CM | POA: Diagnosis not present

## 2022-01-14 DIAGNOSIS — E559 Vitamin D deficiency, unspecified: Secondary | ICD-10-CM | POA: Diagnosis not present

## 2022-01-14 DIAGNOSIS — Z23 Encounter for immunization: Secondary | ICD-10-CM | POA: Diagnosis not present

## 2022-01-14 DIAGNOSIS — I10 Essential (primary) hypertension: Secondary | ICD-10-CM | POA: Diagnosis not present

## 2022-01-21 ENCOUNTER — Ambulatory Visit: Payer: BC Managed Care – PPO | Admitting: Physician Assistant

## 2022-01-21 ENCOUNTER — Encounter: Payer: Self-pay | Admitting: Physician Assistant

## 2022-01-21 ENCOUNTER — Telehealth: Payer: Self-pay | Admitting: Physician Assistant

## 2022-01-21 DIAGNOSIS — F4310 Post-traumatic stress disorder, unspecified: Secondary | ICD-10-CM

## 2022-01-21 DIAGNOSIS — F319 Bipolar disorder, unspecified: Secondary | ICD-10-CM

## 2022-01-21 DIAGNOSIS — F902 Attention-deficit hyperactivity disorder, combined type: Secondary | ICD-10-CM

## 2022-01-21 DIAGNOSIS — E559 Vitamin D deficiency, unspecified: Secondary | ICD-10-CM | POA: Diagnosis not present

## 2022-01-21 DIAGNOSIS — F4323 Adjustment disorder with mixed anxiety and depressed mood: Secondary | ICD-10-CM | POA: Diagnosis not present

## 2022-01-21 MED ORDER — AMPHETAMINE-DEXTROAMPHETAMINE 30 MG PO TABS: 30.0000 mg | ORAL_TABLET | Freq: Two times a day (BID) | ORAL | 0 refills | Status: DC

## 2022-01-21 NOTE — Telephone Encounter (Signed)
Received disability paperwork. Given to [facility] 11/28

## 2022-01-21 NOTE — Progress Notes (Signed)
[facility]  Patient ID: [name],  MRN: [id number]  PCP: Creola Corn, MD  Date of Evaluation: 01/21/2022 Time spent:30 minutes  Chief Complaint:  Chief Complaint   Depression; ADHD; Insomnia; Follow-up    HISTORY/CURRENT STATUS: For routine f/u.  Patient is able to enjoy things.  Energy and motivation are good.  Work is ok, stressful, is considering moving to different position.  No extreme sadness, tearfulness, or feelings of hopelessness. Does feel sad when she thinks about her dog, De Nurse, that she lost earlier this year.  ADLs and personal hygiene are nl. Appetite has not changed.  Weight is stable.   Denies suicidal or homicidal thoughts.  States that attention is good without easy distractibility.  Able to focus on things and finish tasks to completion.   Patient denies increased energy with decreased need for sleep, increased talkativeness, racing thoughts, impulsivity or risky behaviors, increased spending, increased libido, grandiosity, increased irritability or anger, paranoia, or hallucinations.  Denies dizziness, syncope, seizures, numbness, tingling, tremor, tics, unsteady gait, slurred speech, confusion. Denies muscle or joint pain, stiffness, or dystonia.  Individual Medical History/ Review of Systems: Changes? :No   Had labs with Dr. Timothy Lasso on 01/14/2022. Happy with results.    Past medications for mental health diagnoses include: Adderall, Lamictal, Seroquel, Cymbalta, Tegretol, Buspar, Lexapro, Zyprexa   Allergies: Zyprexa [olanzapine]  Current Medications:  Current Outpatient Medications:    atenolol (TENORMIN) 25 MG tablet, Take 25 mg by mouth daily., Disp: , Rfl:    escitalopram (LEXAPRO) 20 MG tablet, Take 1 tablet (20 mg total) by mouth daily., Disp: 90 tablet, Rfl: 1   etonogestrel-ethinyl estradiol (NUVARING) 0.12-0.015 MG/24HR vaginal ring, Place 1 each vaginally every 28 (twenty-eight) days. Insert vaginally and leave in place  for 3 consecutive weeks, then remove for 1 week., Disp: , Rfl:    lamoTRIgine (LAMICTAL) 100 MG tablet, Take 2 tablets (200 mg total) by mouth 2 (two) times daily., Disp: 225 tablet, Rfl: 0   QUEtiapine (SEROQUEL) 100 MG tablet, TAKE 1 TABLET BY MOUTH EVERYDAY AT BEDTIME (Patient taking differently: TAKE 1 TABLET BY MOUTH EVERYDAY AT BEDTIME prn), Disp: 90 tablet, Rfl: 1   Vitamin D, Ergocalciferol, (DRISDOL) 1.25 MG (50000 UNIT) CAPS capsule, Take 1 capsule (50,000 Units total) by mouth every 7 (seven) days., Disp: 12 capsule, Rfl: 0   WEGOVY 1.7 MG/0.75ML SOAJ, SMARTSIG:1.7 Milligram(s) SUB-Q Once a Week, Disp: , Rfl:    [START ON 02/04/2022] amphetamine-dextroamphetamine (ADDERALL) 30 MG tablet, Take 1 tablet by mouth 2 (two) times daily., Disp: 60 tablet, Rfl: 0   [START ON 03/06/2022] amphetamine-dextroamphetamine (ADDERALL) 30 MG tablet, Take 1 tablet by mouth 2 (two) times daily., Disp: 60 tablet, Rfl: 0   [START ON 04/05/2022] amphetamine-dextroamphetamine (ADDERALL) 30 MG tablet, Take 1 tablet by mouth 2 (two) times daily., Disp: 60 tablet, Rfl: 0   ibuprofen (ADVIL,MOTRIN) 800 MG tablet, , Disp: , Rfl:    Liraglutide -Weight Management 18 MG/3ML SOPN, Inject into the skin daily. (Patient not taking: Reported on 05/07/2021), Disp: , Rfl:  Medication Side Effects: none  Family Medical/ Social History: Changes? See HPI.  MENTAL HEALTH EXAM:  There were no vitals taken for this visit.There is no height or weight on file to calculate BMI.  General Appearance: Casual, Neat, and Well Groomed  Eye Contact:  Good  Speech:  Clear and Coherent and Normal Rate  Volume:  Normal  Mood:  Euthymic  Affect:  Congruent  Thought Process:  Goal Directed and  Descriptions of Associations: Circumstantial  Orientation:  Full (Time, Place, and Person)  Thought Content: Logical   Suicidal Thoughts:  No  Homicidal Thoughts:  No  Memory:  WNL  Judgement:  Good  Insight:  Good  Psychomotor Activity:  Normal   Concentration:  Concentration: Good and Attention Span: Good  Recall:  Good  Fund of Knowledge: Good  Language: Good  Assets:  Desire for Improvement  ADL's:  Intact  Cognition: WNL  Prognosis:  Good   Labs 01/14/2022 CBC nl CMP nl Vit D 42.3  DIAGNOSES:    ICD-10-CM   1. Bipolar affective disorder, remission status unspecified (HCC)  F31.9     2. Situational mixed anxiety and depressive disorder  F43.23     3. PTSD (post-traumatic stress disorder)  F43.10     4. Hypovitaminosis D  E55.9     5. Attention deficit hyperactivity disorder (ADHD), combined type  F90.2       Receiving Psychotherapy: Yes  [name], [facility]  RECOMMENDATIONS:  PDMD reviewed.  Last Adderall filled 01/06/2022. I provided 30 minutes of face to face time during this encounter, including time spent before and after the visit in records review, medical decision making, counseling pertinent to today's visit, and charting.   She is doing much better since we increased the Lamictal and also since taking some time off work.  I recommend that she stay out until January 8 as we had previously planned.  And hopefully she will be able to transfer to a new department which will also help with her mental health.  Cont Adderall 30 mg bid. Cont Lexapro 20 mg qd. Continue Lamictal 200 mg, 1 po bid.  Cont Seroquel 100 mg (she only takes a little bite off it, for sleep) Rarely takes.  Continue vitamin D 50,000 IUs weekly. Cont psychotherapy with [name], [facility] Out of work until 03/03/2022. Return in 4 weeks.  Melony Overly, PA-C

## 2022-01-30 DIAGNOSIS — Z0289 Encounter for other administrative examinations: Secondary | ICD-10-CM

## 2022-02-05 NOTE — Telephone Encounter (Signed)
Please advise 

## 2022-02-05 NOTE — Telephone Encounter (Signed)
Unfortunately there are no appts available. If the paperwork already submitted isn't good enough, I'm not sure what to tell her. If she hasn't spoken to HR about this, she needs to let them know I want her out till 1/8 and seeing her before 12/20 isn't likely to change my mind.

## 2022-02-05 NOTE — Telephone Encounter (Signed)
Pt called at 11:20.  She said her job wants her to come back sooner unless she can be seen before 12/20.  There are no appts before then.  She wants to know if there is anything that can be done.  Next appt 1/4

## 2022-02-05 NOTE — Telephone Encounter (Signed)
Her last paper work faxed on 01/28/22 with a RTW date of 03/03/2022

## 2022-02-26 NOTE — Telephone Encounter (Signed)
Has apt 02/27/22

## 2022-02-27 ENCOUNTER — Ambulatory Visit: Payer: BC Managed Care – PPO | Admitting: Physician Assistant

## 2022-02-27 ENCOUNTER — Encounter: Payer: Self-pay | Admitting: Physician Assistant

## 2022-02-27 DIAGNOSIS — F902 Attention-deficit hyperactivity disorder, combined type: Secondary | ICD-10-CM | POA: Diagnosis not present

## 2022-02-27 DIAGNOSIS — E559 Vitamin D deficiency, unspecified: Secondary | ICD-10-CM

## 2022-02-27 DIAGNOSIS — F4323 Adjustment disorder with mixed anxiety and depressed mood: Secondary | ICD-10-CM | POA: Diagnosis not present

## 2022-02-27 DIAGNOSIS — F4321 Adjustment disorder with depressed mood: Secondary | ICD-10-CM | POA: Diagnosis not present

## 2022-02-27 MED ORDER — LAMOTRIGINE 100 MG PO TABS: 200.0000 mg | ORAL_TABLET | Freq: Two times a day (BID) | ORAL | 1 refills | Status: DC

## 2022-02-27 MED ORDER — QUETIAPINE FUMARATE 100 MG PO TABS: 100.0000 mg | ORAL_TABLET | Freq: Every day | ORAL | 1 refills | Status: DC

## 2022-02-27 MED ORDER — QUETIAPINE FUMARATE 100 MG PO TABS: 100.0000 mg | ORAL_TABLET | Freq: Every evening | ORAL | 1 refills | Status: DC | PRN

## 2022-02-27 NOTE — Progress Notes (Signed)
[facility]  Patient ID: [name],  MRN: [id number]  PCP: Shon Baton, MD  Date of Evaluation: 02/27/2022 Time spent:20 minutes  Chief Complaint:  Chief Complaint   Anxiety; Depression; Follow-up    HISTORY/CURRENT STATUS: For routine f/u.  Doing well for the most part. "I still have my moments of being sad or nervous but overall I'm better." Feels ready to go back to work, getting through the holidays was a little tough. She feels like getting back to work will help her now, needs the interaction with other people. She and husband still have issues but they're making it work.  Patient is able to enjoy things.  Energy and motivation are good.  No extreme sadness, tearfulness, or feelings of hopelessness.  Sleeps well most of the time.  ADLs and personal hygiene are normal.    Appetite has not changed.  Weight is stable.  Not having a lot of anxiety.  If she does, it usually for a reason and she is able to push through it.  Denies suicidal or homicidal thoughts.  States that attention is good without easy distractibility.  Able to focus on things and finish tasks to completion.   Patient denies increased energy with decreased need for sleep, increased talkativeness, racing thoughts, impulsivity or risky behaviors, increased spending, increased libido, grandiosity, increased irritability or anger, paranoia, or hallucinations.  Denies dizziness, syncope, seizures, numbness, tingling, tremor, tics, unsteady gait, slurred speech, confusion. Denies muscle or joint pain, stiffness, or dystonia.  Individual Medical History/ Review of Systems: Changes? :No     Past medications for mental health diagnoses include: Adderall, Lamictal, Seroquel, Cymbalta, Tegretol, Buspar, Lexapro, Zyprexa   Allergies: Zyprexa [olanzapine]  Current Medications:  Current Outpatient Medications:    amphetamine-dextroamphetamine (ADDERALL) 30 MG tablet, Take 1 tablet by mouth 2 (two) times  daily., Disp: 60 tablet, Rfl: 0   [START ON 03/06/2022] amphetamine-dextroamphetamine (ADDERALL) 30 MG tablet, Take 1 tablet by mouth 2 (two) times daily., Disp: 60 tablet, Rfl: 0   [START ON 04/05/2022] amphetamine-dextroamphetamine (ADDERALL) 30 MG tablet, Take 1 tablet by mouth 2 (two) times daily., Disp: 60 tablet, Rfl: 0   atenolol (TENORMIN) 25 MG tablet, Take 25 mg by mouth daily., Disp: , Rfl:    escitalopram (LEXAPRO) 20 MG tablet, Take 1 tablet (20 mg total) by mouth daily., Disp: 90 tablet, Rfl: 1   etonogestrel-ethinyl estradiol (NUVARING) 0.12-0.015 MG/24HR vaginal ring, Place 1 each vaginally every 28 (twenty-eight) days. Insert vaginally and leave in place for 3 consecutive weeks, then remove for 1 week., Disp: , Rfl:    ibuprofen (ADVIL,MOTRIN) 800 MG tablet, , Disp: , Rfl:    Vitamin D, Ergocalciferol, (DRISDOL) 1.25 MG (50000 UNIT) CAPS capsule, Take 1 capsule (50,000 Units total) by mouth every 7 (seven) days., Disp: 12 capsule, Rfl: 0   WEGOVY 1.7 MG/0.75ML SOAJ, SMARTSIG:1.7 Milligram(s) SUB-Q Once a Week, Disp: , Rfl:    lamoTRIgine (LAMICTAL) 100 MG tablet, Take 2 tablets (200 mg total) by mouth 2 (two) times daily., Disp: 360 tablet, Rfl: 1   Liraglutide -Weight Management 18 MG/3ML SOPN, Inject into the skin daily. (Patient not taking: Reported on 05/07/2021), Disp: , Rfl:    QUEtiapine (SEROQUEL) 100 MG tablet, Take 1 tablet (100 mg total) by mouth at bedtime as needed., Disp: 90 tablet, Rfl: 1 Medication Side Effects: none  Family Medical/ Social History: Changes? See HPI.  MENTAL HEALTH EXAM:  There were no vitals taken for this visit.There is no height or  weight on file to calculate BMI.  General Appearance: Casual, Neat, and Well Groomed  Eye Contact:  Good  Speech:  Clear and Coherent and Normal Rate  Volume:  Normal  Mood:  Euthymic  Affect:  Congruent  Thought Process:  Goal Directed and Descriptions of Associations: Circumstantial  Orientation:  Full (Time,  Place, and Person)  Thought Content: Logical   Suicidal Thoughts:  No  Homicidal Thoughts:  No  Memory:  WNL  Judgement:  Good  Insight:  Good  Psychomotor Activity:  Normal  Concentration:  Concentration: Good and Attention Span: Good  Recall:  Good  Fund of Knowledge: Good  Language: Good  Assets:  Desire for Improvement Financial Resources/Insurance Housing Transportation Vocational/Educational  ADL's:  Intact  Cognition: WNL  Prognosis:  Good   DIAGNOSES:    ICD-10-CM   1. Situational mixed anxiety and depressive disorder  F43.23     2. Attention deficit hyperactivity disorder (ADHD), combined type  F90.2 QUEtiapine (SEROQUEL) 100 MG tablet    DISCONTINUED: QUEtiapine (SEROQUEL) 100 MG tablet    3. Hypovitaminosis D  E55.9     4. Grief  F43.21       Receiving Psychotherapy: Yes  Rosary Lively, [facility]  RECOMMENDATIONS:  PDMD reviewed.  Last Adderall filled 02/04/2022. I provided 20 minutes of face to face time during this encounter, including time spent before and after the visit in records review, medical decision making, counseling pertinent to today's visit, and charting.   She is doing well so no changes need to be made.  Cont Adderall 30 mg bid. Cont Lexapro 20 mg qd. Continue Lamictal 200 mg, 1 po bid.  Cont Seroquel 100 mg (she only takes a little bite off it, for sleep) Rarely takes.  Continue vitamin D 50,000 IUs weekly. Cont psychotherapy with Rosary Lively, [facility]. Return to work 03/03/2022. Return in 2 months.  Donnal Moat, PA-C

## 2022-03-12 ENCOUNTER — Other Ambulatory Visit: Payer: Self-pay | Admitting: Physician Assistant

## 2022-04-03 ENCOUNTER — Other Ambulatory Visit: Payer: Self-pay | Admitting: Physician Assistant

## 2022-04-03 DIAGNOSIS — F319 Bipolar disorder, unspecified: Secondary | ICD-10-CM

## 2022-04-07 ENCOUNTER — Telehealth: Payer: Self-pay | Admitting: Physician Assistant

## 2022-04-07 NOTE — Telephone Encounter (Signed)
Dawn called at 9:30 to report that she is going to bring some paperwork by this afternoon for FMLA.  She is requesting more time for episodes and appointment.  She said more time had been allotted in the past and she would like to have that time back.  She is thinking 2 days per weeks and time for appts both with her counselor Gareth Eagle) and with Helene Kelp.  Maybe 2-3 appts per month 2-3 hrs for each.  Appt 3/5 but she wants the paperwork done this week.

## 2022-04-07 NOTE — Telephone Encounter (Signed)
See message in regards to [facility] paperwork.

## 2022-04-08 NOTE — Telephone Encounter (Signed)
Received FMLA forms. Given to [facility] 2/13

## 2022-04-11 DIAGNOSIS — Z0289 Encounter for other administrative examinations: Secondary | ICD-10-CM

## 2022-04-11 NOTE — Telephone Encounter (Signed)
Paper work completed and given to [name] for review and signature ?

## 2022-04-15 ENCOUNTER — Telehealth: Payer: Self-pay | Admitting: Physician Assistant

## 2022-04-15 NOTE — Telephone Encounter (Signed)
I think she call yesterday too.

## 2022-04-15 NOTE — Telephone Encounter (Signed)
Pt called at 10:39a to get status of FMLA paperwork.  I provided info from the last encounter.  Pls advise update.  Next appt 3/5

## 2022-04-15 NOTE — Telephone Encounter (Signed)
Noted thanks I will follow up

## 2022-04-17 ENCOUNTER — Other Ambulatory Visit: Payer: Self-pay | Admitting: Physician Assistant

## 2022-04-17 DIAGNOSIS — F319 Bipolar disorder, unspecified: Secondary | ICD-10-CM

## 2022-04-18 NOTE — Telephone Encounter (Signed)
Helene Kelp returned to me today, signed and update

## 2022-04-18 NOTE — Telephone Encounter (Signed)
This have been faxed, pt aware

## 2022-04-23 DIAGNOSIS — R87610 Atypical squamous cells of undetermined significance on cytologic smear of cervix (ASC-US): Secondary | ICD-10-CM | POA: Diagnosis not present

## 2022-04-23 DIAGNOSIS — Z01419 Encounter for gynecological examination (general) (routine) without abnormal findings: Secondary | ICD-10-CM | POA: Diagnosis not present

## 2022-04-23 DIAGNOSIS — Z6827 Body mass index (BMI) 27.0-27.9, adult: Secondary | ICD-10-CM | POA: Diagnosis not present

## 2022-04-29 ENCOUNTER — Ambulatory Visit (INDEPENDENT_AMBULATORY_CARE_PROVIDER_SITE_OTHER): Payer: BC Managed Care – PPO | Admitting: Physician Assistant

## 2022-04-29 ENCOUNTER — Encounter: Payer: Self-pay | Admitting: Physician Assistant

## 2022-04-29 DIAGNOSIS — F4323 Adjustment disorder with mixed anxiety and depressed mood: Secondary | ICD-10-CM | POA: Diagnosis not present

## 2022-04-29 DIAGNOSIS — Z1231 Encounter for screening mammogram for malignant neoplasm of breast: Secondary | ICD-10-CM | POA: Diagnosis not present

## 2022-04-29 DIAGNOSIS — F902 Attention-deficit hyperactivity disorder, combined type: Secondary | ICD-10-CM

## 2022-04-29 MED ORDER — AMPHETAMINE-DEXTROAMPHETAMINE 30 MG PO TABS: 30.0000 mg | ORAL_TABLET | Freq: Two times a day (BID) | ORAL | 0 refills | Status: DC

## 2022-04-29 NOTE — Progress Notes (Signed)
[facility]  Patient ID: [name],  MRN: [id number]  PCP: Shon Baton, MD  Date of Evaluation: 04/29/2022 Time spent:20 minutes  Chief Complaint:  Chief Complaint   Depression; ADHD; Anxiety; Follow-up    HISTORY/CURRENT STATUS: For routine f/u.  Doing well, still misses her dog [name] who died in Sep 23, 2022. Patient is able to enjoy things.  Energy and motivation are good.  Work is going well.  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 No extreme sadness, tearfulness, or feelings of hopelessness.  "I cry when I need to cry." Sleeps well most of the time. ADLs and personal hygiene are normal.   Appetite has not changed.  Weight is stable.  Denies suicidal or homicidal thoughts.  States that attention is good without easy distractibility.  Able to focus on things and finish tasks to completion.   Patient denies increased energy with decreased need for sleep, increased talkativeness, racing thoughts, impulsivity or risky behaviors, increased spending, increased libido, grandiosity, increased irritability or anger, paranoia, or hallucinations.  Denies dizziness, syncope, seizures, numbness, tingling, tremor, tics, unsteady gait, slurred speech, confusion. Denies muscle or joint pain, stiffness, or dystonia.  Individual Medical History/ Review of Systems: Changes? :No     Past medications for mental health diagnoses include: Adderall, Lamictal, Seroquel, Cymbalta, Tegretol, Buspar, Lexapro, Zyprexa   Allergies: Zyprexa [olanzapine]  Current Medications:  Current Outpatient Medications:    atenolol (TENORMIN) 25 MG tablet, Take 25 mg by mouth daily., Disp: , Rfl:    escitalopram (LEXAPRO) 20 MG tablet, TAKE 1 TABLET BY MOUTH EVERY DAY, Disp: 90 tablet, Rfl: 1   etonogestrel-ethinyl estradiol (NUVARING) 0.12-0.015 MG/24HR vaginal ring, Place 1 each vaginally every 28 (twenty-eight) days. Insert vaginally and leave in  place for 3 consecutive weeks, then remove for 1 week., Disp: , Rfl:    lamoTRIgine (LAMICTAL) 100 MG tablet, Take 2 tablets (200 mg total) by mouth 2 (two) times daily., Disp: 360 tablet, Rfl: 1   QUEtiapine (SEROQUEL) 100 MG tablet, Take 1 tablet (100 mg total) by mouth at bedtime as needed., Disp: 90 tablet, Rfl: 1   Vitamin D, Ergocalciferol, (DRISDOL) 1.25 MG (50000 UNIT) CAPS capsule, Take 1 capsule (50,000 Units total) by mouth every 7 (seven) days., Disp: 12 capsule, Rfl: 0   WEGOVY 1.7 MG/0.75ML SOAJ, SMARTSIG:1.7 Milligram(s) SUB-Q Once a Week, Disp: , Rfl:    [START ON 05/03/2022] amphetamine-dextroamphetamine (ADDERALL) 30 MG tablet, Take 1 tablet by mouth 2 (two) times daily., Disp: 60 tablet, Rfl: 0   [START ON 06/02/2022] amphetamine-dextroamphetamine (ADDERALL) 30 MG tablet, Take 1 tablet by mouth 2 (two) times daily., Disp: 60 tablet, Rfl: 0   [START ON 07/01/2022] amphetamine-dextroamphetamine (ADDERALL) 30 MG tablet, Take 1 tablet by mouth 2 (two) times daily., Disp: 60 tablet, Rfl: 0   ibuprofen (ADVIL,MOTRIN) 800 MG tablet, , Disp: , Rfl:    Liraglutide -Weight Management 18 MG/3ML SOPN, Inject into the skin daily. (Patient not taking: Reported on 05/07/2021), Disp: , Rfl:  Medication Side Effects: none  Family Medical/ Social History: Changes? Her dad is in the hospital right now.   MENTAL HEALTH EXAM:  There were no vitals taken for this visit.There is no height or weight on file to calculate BMI.  General Appearance: Casual, Neat, and Well Groomed  Eye Contact:  Good  Speech:  Clear and Coherent and Normal Rate  Volume:  Normal  Mood:  Euthymic  Affect:  Congruent  Thought Process:  Goal Directed and Descriptions of Associations: Circumstantial  Orientation:  Full (Time, Place, and Person)  Thought Content: Logical   Suicidal Thoughts:  No  Homicidal Thoughts:  No  Memory:  WNL  Judgement:  Good  Insight:  Good  Psychomotor Activity:  Normal  Concentration:   Concentration: Good and Attention Span: Good  Recall:  Good  Fund of Knowledge: Good  Language: Good  Assets:  Desire for Improvement Financial Resources/Insurance Housing Physical Health Transportation Vocational/Educational  ADL's:  Intact  Cognition: WNL  Prognosis:  Good   DIAGNOSES:    ICD-10-CM   1. Situational mixed anxiety and depressive disorder  F43.23     2. Attention deficit hyperactivity disorder (ADHD), combined type  F90.2      Receiving Psychotherapy: Yes  Rosary Lively, [facility]  RECOMMENDATIONS:  PDMD reviewed.  Last Adderall filled 04/05/2022. I provided 20 minutes of face to face time during this encounter, including time spent before and after the visit in records review, medical decision making, counseling pertinent to today's visit, and charting.   She's doing well so no changes in meds.   Cont Adderall 30 mg, 1 at breakfast and 1 at lunch.  Cont Lexapro 20 mg qd. Continue Lamictal 200 mg, 1 po bid.  Continue Seroquel 100 mg (she only takes a little bite off it, for sleep) Rarely takes.  Cont psychotherapy with Rosary Lively, [facility]. Return in 2-3 months.   Donnal Moat, PA-C

## 2022-06-02 ENCOUNTER — Other Ambulatory Visit: Payer: Self-pay | Admitting: Physician Assistant

## 2022-06-02 ENCOUNTER — Telehealth: Payer: Self-pay | Admitting: Physician Assistant

## 2022-06-02 MED ORDER — AMPHETAMINE-DEXTROAMPHETAMINE 30 MG PO TABS: 30.0000 mg | ORAL_TABLET | Freq: Two times a day (BID) | ORAL | 0 refills | Status: DC

## 2022-06-02 NOTE — Telephone Encounter (Signed)
sent 

## 2022-06-02 NOTE — Telephone Encounter (Signed)
Pt called at 9:15a.  She would like refill of Adderall sent to [facility]. It's not available at any [facility] per her phone calls to them today.  Next appt 5/24

## 2022-07-10 ENCOUNTER — Ambulatory Visit (INDEPENDENT_AMBULATORY_CARE_PROVIDER_SITE_OTHER): Payer: BC Managed Care – PPO | Admitting: Physician Assistant

## 2022-07-17 DIAGNOSIS — E538 Deficiency of other specified B group vitamins: Secondary | ICD-10-CM | POA: Diagnosis not present

## 2022-07-17 DIAGNOSIS — D649 Anemia, unspecified: Secondary | ICD-10-CM | POA: Diagnosis not present

## 2022-07-18 ENCOUNTER — Ambulatory Visit (INDEPENDENT_AMBULATORY_CARE_PROVIDER_SITE_OTHER): Payer: BC Managed Care – PPO | Admitting: Physician Assistant

## 2022-07-18 ENCOUNTER — Encounter: Payer: Self-pay | Admitting: Physician Assistant

## 2022-07-18 DIAGNOSIS — F4310 Post-traumatic stress disorder, unspecified: Secondary | ICD-10-CM

## 2022-07-18 DIAGNOSIS — F3170 Bipolar disorder, currently in remission, most recent episode unspecified: Secondary | ICD-10-CM | POA: Diagnosis not present

## 2022-07-18 DIAGNOSIS — D649 Anemia, unspecified: Secondary | ICD-10-CM | POA: Diagnosis not present

## 2022-07-18 DIAGNOSIS — F902 Attention-deficit hyperactivity disorder, combined type: Secondary | ICD-10-CM

## 2022-07-18 DIAGNOSIS — E785 Hyperlipidemia, unspecified: Secondary | ICD-10-CM | POA: Diagnosis not present

## 2022-07-18 DIAGNOSIS — E559 Vitamin D deficiency, unspecified: Secondary | ICD-10-CM | POA: Diagnosis not present

## 2022-07-18 MED ORDER — AMPHETAMINE-DEXTROAMPHETAMINE 30 MG PO TABS: 30.0000 mg | ORAL_TABLET | Freq: Two times a day (BID) | ORAL | 0 refills | Status: DC

## 2022-07-18 NOTE — Progress Notes (Signed)
[facility]  Patient ID: [name],  MRN: [id number]  PCP: Heather Corn, MD  Date of Evaluation: 07/18/2022 Time spent:20 minutes  Chief Complaint:  Chief Complaint   Anxiety; Depression; Follow-up    HISTORY/CURRENT STATUS: For routine f/u.  Doing really well. Meds are working like they should. Mood is good most of the time.  Patient is able to enjoy things.  She and family just went on a cruise to [place] and had a good time. Energy and motivation are good.  Work is going well. Works from home 2 days per week. Has a good Production designer, theatre/television/film.   No extreme sadness, tearfulness, or feelings of hopelessness.  Sleeps well, rarely takes the Seroquel. ADLs and personal hygiene are normal. Appetite has not changed.  Still on Wegovy, which has helped her lose weight.  Denies suicidal or homicidal thoughts.  Adderall is still working well. States that attention is good without easy distractibility.  Able to focus on things and finish tasks to completion.   Patient denies increased energy with decreased need for sleep, increased talkativeness, racing thoughts, impulsivity or risky behaviors, increased spending, increased libido, grandiosity, increased irritability or anger, paranoia, or hallucinations.  Denies dizziness, syncope, seizures, numbness, tingling, tremor, tics, unsteady gait, slurred speech, confusion. Denies muscle or joint pain, stiffness, or dystonia.  Individual Medical History/ Review of Systems: Changes? :No     Past medications for mental health diagnoses include: Adderall, Lamictal, Seroquel, Cymbalta, Tegretol, Buspar, Lexapro, Zyprexa   Allergies: Zyprexa [olanzapine]  Current Medications:  Current Outpatient Medications:    atenolol (TENORMIN) 25 MG tablet, Take 25 mg by mouth daily., Disp: , Rfl:    escitalopram (LEXAPRO) 20 MG tablet, TAKE 1 TABLET BY MOUTH EVERY DAY, Disp: 90 tablet, Rfl: 1   etonogestrel-ethinyl estradiol (NUVARING) 0.12-0.015 MG/24HR  vaginal ring, Place 1 each vaginally every 28 (twenty-eight) days. Insert vaginally and leave in place for 3 consecutive weeks, then remove for 1 week., Disp: , Rfl:    ibuprofen (ADVIL,MOTRIN) 800 MG tablet, , Disp: , Rfl:    lamoTRIgine (LAMICTAL) 100 MG tablet, Take 2 tablets (200 mg total) by mouth 2 (two) times daily., Disp: 360 tablet, Rfl: 1   QUEtiapine (SEROQUEL) 100 MG tablet, Take 1 tablet (100 mg total) by mouth at bedtime as needed., Disp: 90 tablet, Rfl: 1   Vitamin D, Ergocalciferol, (DRISDOL) 1.25 MG (50000 UNIT) CAPS capsule, Take 1 capsule (50,000 Units total) by mouth every 7 (seven) days., Disp: 12 capsule, Rfl: 0   WEGOVY 1.7 MG/0.75ML SOAJ, SMARTSIG:1.7 Milligram(s) SUB-Q Once a Week, Disp: , Rfl:    [START ON 07/31/2022] amphetamine-dextroamphetamine (ADDERALL) 30 MG tablet, Take 1 tablet by mouth 2 (two) times daily., Disp: 60 tablet, Rfl: 0   [START ON 08/29/2022] amphetamine-dextroamphetamine (ADDERALL) 30 MG tablet, Take 1 tablet by mouth 2 (two) times daily., Disp: 60 tablet, Rfl: 0   [START ON 09/28/2022] amphetamine-dextroamphetamine (ADDERALL) 30 MG tablet, Take 1 tablet by mouth 2 (two) times daily., Disp: 60 tablet, Rfl: 0   Liraglutide -Weight Management 18 MG/3ML SOPN, Inject into the skin daily. (Patient not taking: Reported on 05/07/2021), Disp: , Rfl:  Medication Side Effects: none  Family Medical/ Social History: Changes? Husband had another back surgery in March. Healing well.   MENTAL HEALTH EXAM:  There were no vitals taken for this visit.There is no height or weight on file to calculate BMI.  General Appearance: Casual, Neat, and Well Groomed  Eye Contact:  Good  Speech:  Clear  and Coherent, Normal Rate, and talkative as usual  Volume:  Normal  Mood:  Euthymic  Affect:  Congruent  Thought Process:  Goal Directed and Descriptions of Associations: Circumstantial  Orientation:  Full (Time, Place, and Person)  Thought Content: Logical   Suicidal Thoughts:  No   Homicidal Thoughts:  No  Memory:  WNL  Judgement:  Good  Insight:  Good  Psychomotor Activity:  Normal  Concentration:  Concentration: Good and Attention Span: Good  Recall:  Good  Fund of Knowledge: Good  Language: Good  Assets:  Desire for Improvement Financial Resources/Insurance Housing Physical Health Resilience Transportation Vocational/Educational  ADL's:  Intact  Cognition: WNL  Prognosis:  Good   DIAGNOSES:    ICD-10-CM   1. Bipolar disorder in remission (HCC)  F31.70     2. PTSD (post-traumatic stress disorder)  F43.10     3. Attention deficit hyperactivity disorder (ADHD), combined type  F90.2      Receiving Psychotherapy: Yes  [name], [facility]  RECOMMENDATIONS:  PDMD reviewed.  Last Adderall filled 07/01/2022. I provided 20 minutes of face to face time during this encounter, including time spent before and after the visit in records review, medical decision making, counseling pertinent to today's visit, and charting.   She continues to do well so no changes will be made.  Cont Adderall 30 mg, 1 at breakfast and 1 at lunch.  Cont Lexapro 20 mg qd. Continue Lamictal 100 mg, 2 po bid.  Continue Seroquel 100 mg (she only takes a little bite off it, for sleep) Rarely takes.  Continue Vitamin D 50,000 iu q week. Per PCP.  Cont psychotherapy with [name], [facility]. Return in 4 months.  Heather Overly, PA-C

## 2022-07-24 DIAGNOSIS — R82998 Other abnormal findings in urine: Secondary | ICD-10-CM | POA: Diagnosis not present

## 2022-07-24 DIAGNOSIS — Z0000 Encounter for general adult medical examination without abnormal findings: Secondary | ICD-10-CM | POA: Diagnosis not present

## 2022-07-24 DIAGNOSIS — E785 Hyperlipidemia, unspecified: Secondary | ICD-10-CM | POA: Diagnosis not present

## 2022-07-24 DIAGNOSIS — E538 Deficiency of other specified B group vitamins: Secondary | ICD-10-CM | POA: Diagnosis not present

## 2022-07-24 DIAGNOSIS — I10 Essential (primary) hypertension: Secondary | ICD-10-CM | POA: Diagnosis not present

## 2022-07-24 DIAGNOSIS — Z1331 Encounter for screening for depression: Secondary | ICD-10-CM | POA: Diagnosis not present

## 2022-07-24 DIAGNOSIS — Z9884 Bariatric surgery status: Secondary | ICD-10-CM | POA: Diagnosis not present

## 2022-08-23 ENCOUNTER — Other Ambulatory Visit: Payer: Self-pay | Admitting: Physician Assistant

## 2022-09-03 ENCOUNTER — Other Ambulatory Visit: Payer: Self-pay | Admitting: Physician Assistant

## 2022-09-03 DIAGNOSIS — F319 Bipolar disorder, unspecified: Secondary | ICD-10-CM

## 2022-09-10 DIAGNOSIS — H6120 Impacted cerumen, unspecified ear: Secondary | ICD-10-CM | POA: Diagnosis not present

## 2022-09-25 ENCOUNTER — Telehealth: Payer: Self-pay | Admitting: Physician Assistant

## 2022-09-25 NOTE — Telephone Encounter (Signed)
LF 7/4, due 8/2

## 2022-09-25 NOTE — Telephone Encounter (Signed)
Next visit is 11/21/22. Requesting refill on Adderall 30 mg called to [facility] in [place], [place].   [address]  [phone]  Per patient it is in stock at this location.

## 2022-09-26 ENCOUNTER — Other Ambulatory Visit: Payer: Self-pay

## 2022-09-26 MED ORDER — AMPHETAMINE-DEXTROAMPHETAMINE 30 MG PO TABS: 30.0000 mg | ORAL_TABLET | Freq: Two times a day (BID) | ORAL | 0 refills | Status: DC

## 2022-09-26 NOTE — Telephone Encounter (Signed)
Pended.

## 2022-09-26 NOTE — Telephone Encounter (Signed)
Canceled 8/4 Rx

## 2022-10-24 ENCOUNTER — Telehealth: Payer: Self-pay | Admitting: Physician Assistant

## 2022-10-24 ENCOUNTER — Other Ambulatory Visit: Payer: Self-pay

## 2022-10-24 MED ORDER — AMPHETAMINE-DEXTROAMPHETAMINE 30 MG PO TABS: 30.0000 mg | ORAL_TABLET | Freq: Two times a day (BID) | ORAL | 0 refills | Status: DC

## 2022-10-24 NOTE — Telephone Encounter (Signed)
Pended.

## 2022-10-24 NOTE — Telephone Encounter (Signed)
Dawn called at 9:13 to request refill of her Adderall 30mg .  She said it is due Sunday, but with Monday being a holiday if the pharmacy doesn't already have the prescription she won't be able get until Tuesday.  Appt 9/27.  [facility] to  [id number] [place],  - [address]

## 2022-11-21 ENCOUNTER — Encounter: Payer: Self-pay | Admitting: Physician Assistant

## 2022-11-21 ENCOUNTER — Ambulatory Visit (INDEPENDENT_AMBULATORY_CARE_PROVIDER_SITE_OTHER): Payer: BC Managed Care – PPO | Admitting: Physician Assistant

## 2022-11-21 DIAGNOSIS — F902 Attention-deficit hyperactivity disorder, combined type: Secondary | ICD-10-CM

## 2022-11-21 DIAGNOSIS — E559 Vitamin D deficiency, unspecified: Secondary | ICD-10-CM | POA: Diagnosis not present

## 2022-11-21 DIAGNOSIS — F4310 Post-traumatic stress disorder, unspecified: Secondary | ICD-10-CM

## 2022-11-21 DIAGNOSIS — F3170 Bipolar disorder, currently in remission, most recent episode unspecified: Secondary | ICD-10-CM | POA: Diagnosis not present

## 2022-11-21 MED ORDER — AMPHETAMINE-DEXTROAMPHETAMINE 30 MG PO TABS: 30.0000 mg | ORAL_TABLET | Freq: Two times a day (BID) | ORAL | 0 refills | Status: DC

## 2022-11-21 NOTE — Progress Notes (Unsigned)
[facility]  Patient ID: [name],  MRN: [id number]  PCP: Creola Corn, MD  Date of Evaluation: 11/21/2022 Time spent:30 minutes  Chief Complaint:  Chief Complaint   ADHD; Depression; Anxiety; Follow-up    HISTORY/CURRENT STATUS: For routine f/u.  Doing really well. Patient is able to enjoy things.  Energy and motivation are good.  Work is going well.  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   They got a puppy.  It's a lot of work but she's enjoying it. No extreme sadness, tearfulness, or feelings of hopelessness.  Sleeps well most of the time. ADLs and personal hygiene are normal.   No change in memory.  Appetite has not changed.  Weight is stable.   Denies suicidal or homicidal thoughts.  Adderall is still working well.  States that attention is good without easy distractibility.  Able to focus on things and finish tasks to completion.   Patient denies increased energy with decreased need for sleep, increased talkativeness, racing thoughts, impulsivity or risky behaviors, increased spending, increased libido, grandiosity, increased irritability or anger, paranoia, or hallucinations.  Denies dizziness, syncope, seizures, numbness, tingling, tremor, tics, unsteady gait, slurred speech, confusion. Denies muscle or joint pain, stiffness, or dystonia. Denies unexplained weight loss, frequent infections, or sores that heal slowly.  No polyphagia, polydipsia, or polyuria. Denies visual changes or paresthesias.    Individual Medical History/ Review of Systems: Changes? :No     Past medications for mental health diagnoses include: Adderall, Lamictal, Seroquel, Cymbalta, Tegretol, Buspar, Lexapro, Zyprexa   Allergies: Zyprexa [olanzapine]  Current Medications:  Current Outpatient Medications:    atenolol (TENORMIN) 25 MG tablet, Take 25 mg by mouth daily., Disp: , Rfl:    escitalopram (LEXAPRO) 20 MG tablet, TAKE 1 TABLET BY MOUTH EVERY DAY, Disp: 90 tablet,  Rfl: 1   etonogestrel-ethinyl estradiol (NUVARING) 0.12-0.015 MG/24HR vaginal ring, Place 1 each vaginally every 28 (twenty-eight) days. Insert vaginally and leave in place for 3 consecutive weeks, then remove for 1 week., Disp: , Rfl:    lamoTRIgine (LAMICTAL) 100 MG tablet, TAKE 2 TABLETS BY MOUTH 2 TIMES DAILY., Disp: 360 tablet, Rfl: 1   QUEtiapine (SEROQUEL) 100 MG tablet, Take 1 tablet (100 mg total) by mouth at bedtime as needed., Disp: 90 tablet, Rfl: 1   Vitamin D, Ergocalciferol, (DRISDOL) 1.25 MG (50000 UNIT) CAPS capsule, Take 1 capsule (50,000 Units total) by mouth every 7 (seven) days., Disp: 12 capsule, Rfl: 0   WEGOVY 2.4 MG/0.75ML SOAJ, Inject 2.4 mg into the skin once a week., Disp: , Rfl:    amphetamine-dextroamphetamine (ADDERALL) 30 MG tablet, Take 1 tablet by mouth 2 (two) times daily., Disp: 60 tablet, Rfl: 0   [START ON 12/20/2022] amphetamine-dextroamphetamine (ADDERALL) 30 MG tablet, Take 1 tablet by mouth 2 (two) times daily., Disp: 60 tablet, Rfl: 0   [START ON 01/19/2023] amphetamine-dextroamphetamine (ADDERALL) 30 MG tablet, Take 1 tablet by mouth 2 (two) times daily., Disp: 60 tablet, Rfl: 0   ibuprofen (ADVIL,MOTRIN) 800 MG tablet, , Disp: , Rfl:    Liraglutide -Weight Management 18 MG/3ML SOPN, Inject into the skin daily. (Patient not taking: Reported on 05/07/2021), Disp: , Rfl:    WEGOVY 1.7 MG/0.75ML SOAJ, SMARTSIG:1.7 Milligram(s) SUB-Q Once a Week (Patient not taking: Reported on 11/21/2022), Disp: , Rfl:  Medication Side Effects: none  Family Medical/ Social History: Changes?  New puppy, pit bull name [facility]  MENTAL HEALTH EXAM:  There were no vitals taken for this visit.There is  no height or weight on file to calculate BMI.  General Appearance: Casual, Neat, and Well Groomed  Eye Contact:  Good  Speech:  Clear and Coherent, Normal Rate, and talkative as usual  Volume:  Normal  Mood:  Euthymic  Affect:  Congruent  Thought Process:  Goal Directed and  Descriptions of Associations: Circumstantial  Orientation:  Full (Time, Place, and Person)  Thought Content: Logical   Suicidal Thoughts:  No  Homicidal Thoughts:  No  Memory:  WNL  Judgement:  Good  Insight:  Good  Psychomotor Activity:  Normal  Concentration:  Concentration: Good and Attention Span: Good  Recall:  Good  Fund of Knowledge: Good  Language: Good  Assets:  Communication Skills Desire for Improvement Financial Resources/Insurance Housing Physical Health Resilience Transportation Vocational/Educational  ADL's:  Intact  Cognition: WNL  Prognosis:  Good   Labs 07/18/2022 CBC with diff nl Ferritin 10.7 B 12 > 2000 TIBC and Iron nl CMP Glu 92 all values are nl Lipids T chol 223, Trig 85, HDL 72, LDL 134 TSH 2.4 Vit D 32.4  DIAGNOSES:    ICD-10-CM   1. Bipolar disorder in remission (HCC)  F31.70     2. Attention deficit hyperactivity disorder (ADHD), combined type  F90.2     3. PTSD (post-traumatic stress disorder)  F43.10     4. Hypovitaminosis D  E55.9      Receiving Psychotherapy: Yes  [name], [facility]  RECOMMENDATIONS:  PDMD reviewed.  Last Adderall filled 10/25/2022. I provided 30 minutes of face to face time during this encounter, including time spent before and after the visit in records review, medical decision making, counseling pertinent to today's visit, and charting.   She's doing well so no changes need to be made.   Cont Adderall 30 mg, 1 at breakfast and 1 at lunch.  Cont Lexapro 20 mg qd. Continue Lamictal 100 mg, 2 po bid.  Continue Seroquel 100 mg (she only takes a little bite off it, for sleep) Rarely takes.  Continue Vitamin D 50,000 iu q week. Per PCP.  Cont psychotherapy with [name], [facility]. Return in 4 months.  Melony Overly, PA-C

## 2022-12-19 ENCOUNTER — Telehealth: Payer: Self-pay | Admitting: Physician Assistant

## 2022-12-19 NOTE — Telephone Encounter (Signed)
[name] called at 10:04 to request refill of her Adderall 30mg .  Appt 03/20/23.  [id number] [place] [place] - [address]

## 2022-12-19 NOTE — Telephone Encounter (Signed)
Patient already has Rx at pharmacy for fill tomorrow. Let her know.

## 2023-01-16 ENCOUNTER — Other Ambulatory Visit: Payer: Self-pay

## 2023-01-16 ENCOUNTER — Telehealth: Payer: Self-pay | Admitting: Physician Assistant

## 2023-01-16 NOTE — Telephone Encounter (Signed)
Dawn cxalled at 9:17 to request refill of her Adderall.  Appt 03/20/23.  [id number] [place], [place] - [address]

## 2023-01-16 NOTE — Telephone Encounter (Signed)
Called pt to let her know she has an rx ready to be picked up 11/25

## 2023-01-19 ENCOUNTER — Other Ambulatory Visit: Payer: Self-pay | Admitting: Physician Assistant

## 2023-01-19 ENCOUNTER — Telehealth: Payer: Self-pay

## 2023-01-19 ENCOUNTER — Telehealth: Payer: Self-pay | Admitting: Physician Assistant

## 2023-01-19 DIAGNOSIS — F902 Attention-deficit hyperactivity disorder, combined type: Secondary | ICD-10-CM

## 2023-01-19 NOTE — Telephone Encounter (Signed)
Approval received. Patient notified.

## 2023-01-19 NOTE — Telephone Encounter (Signed)
PA Amphetamine-Dextroamphetamine 30MG  tablets #60/30 [employer]  Approved Effective:  01/19/2023 to 01/18/2026

## 2023-01-19 NOTE — Telephone Encounter (Signed)
[name] called at 10:20 to report that her Adderall needs a PA.  She is concerned now about being able to get it soon.  Please do the PA renewal so the pharmacy can fill the prescription

## 2023-01-19 NOTE — Telephone Encounter (Signed)
PA started

## 2023-01-20 DIAGNOSIS — I10 Essential (primary) hypertension: Secondary | ICD-10-CM | POA: Diagnosis not present

## 2023-01-20 DIAGNOSIS — E559 Vitamin D deficiency, unspecified: Secondary | ICD-10-CM | POA: Diagnosis not present

## 2023-01-20 DIAGNOSIS — Z23 Encounter for immunization: Secondary | ICD-10-CM | POA: Diagnosis not present

## 2023-01-20 DIAGNOSIS — E785 Hyperlipidemia, unspecified: Secondary | ICD-10-CM | POA: Diagnosis not present

## 2023-02-06 ENCOUNTER — Other Ambulatory Visit: Payer: Self-pay | Admitting: Physician Assistant

## 2023-02-16 ENCOUNTER — Other Ambulatory Visit: Payer: Self-pay

## 2023-02-16 ENCOUNTER — Telehealth: Payer: Self-pay | Admitting: Physician Assistant

## 2023-02-16 MED ORDER — AMPHETAMINE-DEXTROAMPHETAMINE 30 MG PO TABS: 30.0000 mg | ORAL_TABLET | Freq: Two times a day (BID) | ORAL | 0 refills | Status: DC

## 2023-02-16 NOTE — Telephone Encounter (Signed)
Pt called asking for a refill on her adderall 30 mg. [facility] [place]

## 2023-02-16 NOTE — Telephone Encounter (Signed)
Pended adderall 30 mg to [facility] pharmacy.

## 2023-02-27 ENCOUNTER — Other Ambulatory Visit: Payer: Self-pay | Admitting: Physician Assistant

## 2023-02-27 DIAGNOSIS — F902 Attention-deficit hyperactivity disorder, combined type: Secondary | ICD-10-CM

## 2023-02-27 NOTE — Telephone Encounter (Signed)
 Rejected was filled 3 ms 111/24 and 12/13

## 2023-03-03 ENCOUNTER — Other Ambulatory Visit: Payer: Self-pay | Admitting: Physician Assistant

## 2023-03-03 DIAGNOSIS — R52 Pain, unspecified: Secondary | ICD-10-CM | POA: Diagnosis not present

## 2023-03-03 DIAGNOSIS — J069 Acute upper respiratory infection, unspecified: Secondary | ICD-10-CM | POA: Diagnosis not present

## 2023-03-03 DIAGNOSIS — J029 Acute pharyngitis, unspecified: Secondary | ICD-10-CM | POA: Diagnosis not present

## 2023-03-05 ENCOUNTER — Telehealth: Payer: Self-pay | Admitting: Physician Assistant

## 2023-03-05 MED ORDER — LAMOTRIGINE 100 MG PO TABS: 200.0000 mg | ORAL_TABLET | Freq: Two times a day (BID) | ORAL | 0 refills | Status: DC

## 2023-03-05 NOTE — Telephone Encounter (Signed)
 [name] called to request refill of her lamictal. Appt 1/24.  [id number] [place], [place] - [address]     She requested a 90 day supply.

## 2023-03-05 NOTE — Telephone Encounter (Signed)
 RF sent for Lamictal

## 2023-03-17 ENCOUNTER — Other Ambulatory Visit: Payer: Self-pay | Admitting: Physician Assistant

## 2023-03-17 ENCOUNTER — Telehealth: Payer: Self-pay | Admitting: Physician Assistant

## 2023-03-17 ENCOUNTER — Other Ambulatory Visit: Payer: Self-pay

## 2023-03-17 DIAGNOSIS — F319 Bipolar disorder, unspecified: Secondary | ICD-10-CM

## 2023-03-17 MED ORDER — ESCITALOPRAM OXALATE 20 MG PO TABS: 20.0000 mg | ORAL_TABLET | Freq: Every day | ORAL | 0 refills | Status: DC

## 2023-03-17 MED ORDER — AMPHETAMINE-DEXTROAMPHETAMINE 30 MG PO TABS: 30.0000 mg | ORAL_TABLET | Freq: Two times a day (BID) | ORAL | 0 refills | Status: DC

## 2023-03-17 NOTE — Telephone Encounter (Signed)
Pt called in - Needs RF on Adderall 30mg  and her Lexapro. Has appt 03/20/23 Send [facility]: [id number] [place], [place] - [address]  [address]

## 2023-03-17 NOTE — Telephone Encounter (Signed)
Sent lexapro 20 mg and pended adderall 30 mg to [facility] pharmacy

## 2023-03-20 ENCOUNTER — Ambulatory Visit: Payer: BC Managed Care – PPO | Admitting: Physician Assistant

## 2023-03-20 ENCOUNTER — Telehealth: Payer: Self-pay | Admitting: Physician Assistant

## 2023-03-20 NOTE — Telephone Encounter (Signed)
Pt rs appt for today. She said that she is having crying spells. She would like to know if she can increase her lexapro to 40 mg. Please give her a call at [phone]

## 2023-03-23 NOTE — Telephone Encounter (Signed)
Have her increase Lexapro 20 mg from 1 pill to 1.5 pills (30 mg)

## 2023-03-23 NOTE — Telephone Encounter (Signed)
Intermittent FMLA will be the same as last time.

## 2023-03-23 NOTE — Telephone Encounter (Signed)
Pt notified of lexapro increase.   She stated that she was bringing in her FMLA paper work and its due by the end of the month.Marland Kitchen

## 2023-04-01 DIAGNOSIS — Z0289 Encounter for other administrative examinations: Secondary | ICD-10-CM

## 2023-04-02 NOTE — Telephone Encounter (Signed)
Forms completed and given to Teresa to review and sign 

## 2023-04-06 ENCOUNTER — Telehealth: Payer: Self-pay | Admitting: Physician Assistant

## 2023-04-06 NOTE — Telephone Encounter (Signed)
 Medical job accommodation request form faxed to [place] [employer] [place].

## 2023-04-14 ENCOUNTER — Other Ambulatory Visit: Payer: Self-pay

## 2023-04-14 ENCOUNTER — Telehealth: Payer: Self-pay | Admitting: Physician Assistant

## 2023-04-14 MED ORDER — AMPHETAMINE-DEXTROAMPHETAMINE 30 MG PO TABS: 30.0000 mg | ORAL_TABLET | Freq: Two times a day (BID) | ORAL | 0 refills | Status: DC

## 2023-04-14 NOTE — Telephone Encounter (Signed)
 Pended adderall 30 mg to [facility] pharm.

## 2023-04-14 NOTE — Telephone Encounter (Signed)
Pt called asking for a refill on her adderall 30 mg . [facility] [address] [facility] [place]

## 2023-05-04 DIAGNOSIS — Z124 Encounter for screening for malignant neoplasm of cervix: Secondary | ICD-10-CM | POA: Diagnosis not present

## 2023-05-04 DIAGNOSIS — Z01411 Encounter for gynecological examination (general) (routine) with abnormal findings: Secondary | ICD-10-CM | POA: Diagnosis not present

## 2023-05-04 DIAGNOSIS — Z1231 Encounter for screening mammogram for malignant neoplasm of breast: Secondary | ICD-10-CM | POA: Diagnosis not present

## 2023-05-04 DIAGNOSIS — Z01419 Encounter for gynecological examination (general) (routine) without abnormal findings: Secondary | ICD-10-CM | POA: Diagnosis not present

## 2023-05-05 ENCOUNTER — Ambulatory Visit: Payer: BC Managed Care – PPO | Admitting: Physician Assistant

## 2023-05-11 ENCOUNTER — Telehealth: Payer: Self-pay | Admitting: Physician Assistant

## 2023-05-11 ENCOUNTER — Other Ambulatory Visit: Payer: Self-pay

## 2023-05-11 DIAGNOSIS — F319 Bipolar disorder, unspecified: Secondary | ICD-10-CM

## 2023-05-11 MED ORDER — ESCITALOPRAM OXALATE 20 MG PO TABS: 30.0000 mg | ORAL_TABLET | Freq: Every day | ORAL | 2 refills | Status: DC

## 2023-05-11 NOTE — Telephone Encounter (Signed)
 LF 2/18, due 3/19

## 2023-05-11 NOTE — Telephone Encounter (Signed)
 Pt called and  asked for a refill on her adderall 30 mg. [facility] [place] [facility] [address]

## 2023-05-11 NOTE — Telephone Encounter (Signed)
 Lexapro [facility] 20 mg[facility]

## 2023-05-11 NOTE — Telephone Encounter (Signed)
 [name] called at 3:28 to request also of her Lexapro.  She will be out early because it was increased to 1.5 pills daily on 03/23/23.  Appt 5/14.  [facility]    [id number] [place] [place] - [address]

## 2023-05-13 ENCOUNTER — Other Ambulatory Visit: Payer: Self-pay

## 2023-05-13 MED ORDER — AMPHETAMINE-DEXTROAMPHETAMINE 30 MG PO TABS: 30.0000 mg | ORAL_TABLET | Freq: Two times a day (BID) | ORAL | 0 refills | Status: DC

## 2023-05-13 NOTE — Telephone Encounter (Signed)
 Pt called at 9:10a requesting status of Adderall.  Looks like it should get filled today, if not, pls contact pt back.  I know [name] is out again today but I assume someone will sign her scripts.  Next appt 5/14

## 2023-05-13 NOTE — Telephone Encounter (Signed)
 Pt called checking the status of this refill.  I told her  that a different provider would have to sign and send it since teresa is out

## 2023-05-13 NOTE — Telephone Encounter (Signed)
 [facility] [place]

## 2023-05-31 ENCOUNTER — Other Ambulatory Visit: Payer: Self-pay | Admitting: Psychiatry

## 2023-06-12 ENCOUNTER — Other Ambulatory Visit: Payer: Self-pay | Admitting: Physician Assistant

## 2023-06-12 DIAGNOSIS — F319 Bipolar disorder, unspecified: Secondary | ICD-10-CM

## 2023-07-05 ENCOUNTER — Other Ambulatory Visit: Payer: Self-pay | Admitting: Physician Assistant

## 2023-07-05 DIAGNOSIS — F319 Bipolar disorder, unspecified: Secondary | ICD-10-CM

## 2023-07-07 ENCOUNTER — Ambulatory Visit: Admitting: Physician Assistant

## 2023-07-08 ENCOUNTER — Ambulatory Visit: Admitting: Physician Assistant

## 2023-07-08 ENCOUNTER — Encounter: Payer: Self-pay | Admitting: Physician Assistant

## 2023-07-08 DIAGNOSIS — F319 Bipolar disorder, unspecified: Secondary | ICD-10-CM | POA: Diagnosis not present

## 2023-07-08 DIAGNOSIS — F3170 Bipolar disorder, currently in remission, most recent episode unspecified: Secondary | ICD-10-CM | POA: Diagnosis not present

## 2023-07-08 DIAGNOSIS — F902 Attention-deficit hyperactivity disorder, combined type: Secondary | ICD-10-CM

## 2023-07-08 DIAGNOSIS — F4310 Post-traumatic stress disorder, unspecified: Secondary | ICD-10-CM

## 2023-07-08 MED ORDER — AMPHETAMINE-DEXTROAMPHETAMINE 30 MG PO TABS: 30.0000 mg | ORAL_TABLET | Freq: Two times a day (BID) | ORAL | 0 refills | Status: DC

## 2023-07-08 MED ORDER — ESCITALOPRAM OXALATE 20 MG PO TABS: 30.0000 mg | ORAL_TABLET | Freq: Every day | ORAL | 1 refills | Status: DC

## 2023-07-08 MED ORDER — LAMOTRIGINE 100 MG PO TABS: 200.0000 mg | ORAL_TABLET | Freq: Two times a day (BID) | ORAL | 1 refills | Status: DC

## 2023-07-08 MED ORDER — QUETIAPINE FUMARATE 100 MG PO TABS: 100.0000 mg | ORAL_TABLET | Freq: Every evening | ORAL | 1 refills | Status: DC | PRN

## 2023-07-08 NOTE — Progress Notes (Signed)
 [facility]  Patient ID: [name],  MRN: [id number]  PCP: Margarete Sharps, MD  Date of Evaluation: 07/08/2023 Time spent:20 minutes  Chief Complaint:  Chief Complaint   Depression; ADHD; Follow-up    HISTORY/CURRENT STATUS: For routine f/u.  Doing well.  Patient is able to enjoy things.  Energy and motivation are good.  Work is going well.   No extreme sadness, tearfulness, or feelings of hopelessness.  Sleeps well most of the time.  ADLs and personal hygiene are normal.    Appetite has not changed.  Weight is stable. No c/o anxiety.  Denies suicidal or homicidal thoughts.  States that attention is good without easy distractibility.  Able to focus on things and finish tasks to completion.   Patient denies increased energy with decreased need for sleep, increased talkativeness, racing thoughts, impulsivity or risky behaviors, increased spending, increased libido, grandiosity, increased irritability or anger, paranoia, or hallucinations.  Individual Medical History/ Review of Systems: Changes? :No     Past medications for mental health diagnoses include: Adderall, Lamictal , Seroquel , Cymbalta, Tegretol, Buspar, Lexapro , Zyprexa    Allergies: Zyprexa  [olanzapine ]  Current Medications:  Current Outpatient Medications:    atenolol (TENORMIN) 25 MG tablet, Take 25 mg by mouth daily., Disp: , Rfl:    etonogestrel-ethinyl estradiol (NUVARING) 0.12-0.015 MG/24HR vaginal ring, Place 1 each vaginally every 28 (twenty-eight) days. Insert vaginally and leave in place for 3 consecutive weeks, then remove for 1 week., Disp: , Rfl:    Vitamin D , Ergocalciferol , (DRISDOL ) 1.25 MG (50000 UNIT) CAPS capsule, Take 1 capsule (50,000 Units total) by mouth every 7 (seven) days., Disp: 12 capsule, Rfl: 0   WEGOVY 2.4 MG/0.75ML SOAJ, Inject 2.4 mg into the skin once a week., Disp: , Rfl:    [START ON 07/09/2023] amphetamine -dextroamphetamine  (ADDERALL) 30 MG tablet, Take 1 tablet by  mouth 2 (two) times daily., Disp: 60 tablet, Rfl: 0   [START ON 08/07/2023] amphetamine -dextroamphetamine  (ADDERALL) 30 MG tablet, Take 1 tablet by mouth 2 (two) times daily., Disp: 60 tablet, Rfl: 0   [START ON 09/05/2023] amphetamine -dextroamphetamine  (ADDERALL) 30 MG tablet, Take 1 tablet by mouth 2 (two) times daily., Disp: 60 tablet, Rfl: 0   escitalopram  (LEXAPRO ) 20 MG tablet, Take 1.5 tablets (30 mg total) by mouth daily., Disp: 135 tablet, Rfl: 1   ibuprofen (ADVIL,MOTRIN) 800 MG tablet, , Disp: , Rfl:    lamoTRIgine  (LAMICTAL ) 100 MG tablet, Take 2 tablets (200 mg total) by mouth 2 (two) times daily., Disp: 360 tablet, Rfl: 1   Liraglutide -Weight Management 18 MG/3ML SOPN, Inject into the skin daily. (Patient not taking: Reported on 05/07/2021), Disp: , Rfl:    QUEtiapine  (SEROQUEL ) 100 MG tablet, Take 1 tablet (100 mg total) by mouth at bedtime as needed., Disp: 90 tablet, Rfl: 1   WEGOVY 1.7 MG/0.75ML SOAJ, SMARTSIG:1.7 Milligram(s) SUB-Q Once a Week (Patient not taking: Reported on 07/08/2023), Disp: , Rfl:  Medication Side Effects: none  Family Medical/ Social History: Changes?   no  MENTAL HEALTH EXAM:  There were no vitals taken for this visit.There is no height or weight on file to calculate BMI.  General Appearance: Casual, Neat, and Well Groomed  Eye Contact:  Good  Speech:  Clear and Coherent, Normal Rate, and Talkative  Volume:  Normal  Mood:  Euthymic  Affect:  Congruent  Thought Process:  Goal Directed and Descriptions of Associations: Circumstantial  Orientation:  Full (Time, Place, and Person)  Thought Content: Logical   Suicidal Thoughts:  No  Homicidal Thoughts:  No  Memory:  WNL  Judgement:  Good  Insight:  Good  Psychomotor Activity:  Normal  Concentration:  Concentration: Good and Attention Span: Good  Recall:  Good  Fund of Knowledge: Good  Language: Good  Assets:  Communication Skills Desire for Improvement Financial  Resources/Insurance Housing Physical Health Resilience Transportation Vocational/Educational  ADL's:  Intact  Cognition: WNL  Prognosis:  Good   DIAGNOSES:    ICD-10-CM   1. Attention deficit hyperactivity disorder (ADHD), combined type  F90.2 QUEtiapine  (SEROQUEL ) 100 MG tablet    2. Bipolar disorder in remission (HCC)  F31.70     3. PTSD (post-traumatic stress disorder)  F43.10     4. Bipolar affective disorder, remission status unspecified (HCC)  F31.9 escitalopram  (LEXAPRO ) 20 MG tablet      Receiving Psychotherapy: Yes  Jacqui Mau, [facility]  RECOMMENDATIONS:  PDMD reviewed.  Last Adderall filled 06/10/2023.   I provided 20 minutes of face to face time during this encounter, including time spent before and after the visit in records review, medical decision making, counseling pertinent to today's visit, and charting.   She's doing well so no changes need to be made.   Cont Adderall 30 mg, 1 at breakfast and 1 at lunch.  Cont Lexapro  30 mg qd. Continue Lamictal  100 mg, 2 po bid.  Continue Seroquel  100 mg (she only takes a little bite off it, for sleep) Rarely takes.  Continue Vitamin D  50,000 iu q week. Per PCP.  Cont psychotherapy with Jacqui Mau, [facility]. Return in 6 months.  Marvia Slocumb, PA-C

## 2023-07-21 DIAGNOSIS — E538 Deficiency of other specified B group vitamins: Secondary | ICD-10-CM | POA: Diagnosis not present

## 2023-07-21 DIAGNOSIS — E785 Hyperlipidemia, unspecified: Secondary | ICD-10-CM | POA: Diagnosis not present

## 2023-07-21 DIAGNOSIS — D649 Anemia, unspecified: Secondary | ICD-10-CM | POA: Diagnosis not present

## 2023-07-28 DIAGNOSIS — Z0000 Encounter for general adult medical examination without abnormal findings: Secondary | ICD-10-CM | POA: Diagnosis not present

## 2023-07-28 DIAGNOSIS — Z1389 Encounter for screening for other disorder: Secondary | ICD-10-CM | POA: Diagnosis not present

## 2023-07-28 DIAGNOSIS — I10 Essential (primary) hypertension: Secondary | ICD-10-CM | POA: Diagnosis not present

## 2023-07-28 DIAGNOSIS — Z1331 Encounter for screening for depression: Secondary | ICD-10-CM | POA: Diagnosis not present

## 2023-07-28 DIAGNOSIS — R82998 Other abnormal findings in urine: Secondary | ICD-10-CM | POA: Diagnosis not present

## 2023-10-22 ENCOUNTER — Telehealth: Payer: Self-pay | Admitting: Physician Assistant

## 2023-10-22 NOTE — Telephone Encounter (Signed)
 Pt called asking for a refill on her adderall 30 mg. [facility] [address] [facility] [place]. Next appt 01/05/24

## 2023-10-22 NOTE — Telephone Encounter (Signed)
 LF 8/4, due 9/2

## 2023-10-27 ENCOUNTER — Other Ambulatory Visit: Payer: Self-pay

## 2023-10-27 DIAGNOSIS — F902 Attention-deficit hyperactivity disorder, combined type: Secondary | ICD-10-CM

## 2023-10-27 MED ORDER — AMPHETAMINE-DEXTROAMPHETAMINE 30 MG PO TABS: 30.0000 mg | ORAL_TABLET | Freq: Two times a day (BID) | ORAL | 0 refills | Status: DC

## 2023-10-27 NOTE — Telephone Encounter (Signed)
 Pt reporting her son passed away this weekend. She is working from home 3 days a week now and would like to have the flexibility to have the option to work from home 4 days a week. She is currently on bereavement leave. I asked her to have HR send us  the paperwork, said this is different paperwork than what has been done previously.   Pended Adderall RF separately.

## 2023-10-27 NOTE — Telephone Encounter (Signed)
 Pt needs refill of Adderall. Pt also said her son passed away this weekend and wants to know if Verneita can fill out paperwork for her to work from home.      [facility]  [address]  [place]

## 2023-10-27 NOTE — Telephone Encounter (Signed)
Pended 3 RF.

## 2023-10-27 NOTE — Telephone Encounter (Signed)
 I called Dawn, my condolences given.  She states she doesn't  need any med changes right now but needs an appt soon.  I'll work her and and will ask admin to call and make appt asap.

## 2023-10-29 ENCOUNTER — Encounter: Payer: Self-pay | Admitting: Physician Assistant

## 2023-10-29 ENCOUNTER — Ambulatory Visit: Admitting: Physician Assistant

## 2023-10-29 DIAGNOSIS — F4310 Post-traumatic stress disorder, unspecified: Secondary | ICD-10-CM | POA: Diagnosis not present

## 2023-10-29 DIAGNOSIS — F3170 Bipolar disorder, currently in remission, most recent episode unspecified: Secondary | ICD-10-CM | POA: Diagnosis not present

## 2023-10-29 DIAGNOSIS — F4321 Adjustment disorder with depressed mood: Secondary | ICD-10-CM | POA: Diagnosis not present

## 2023-10-29 DIAGNOSIS — Z634 Disappearance and death of family member: Secondary | ICD-10-CM

## 2023-10-29 DIAGNOSIS — F902 Attention-deficit hyperactivity disorder, combined type: Secondary | ICD-10-CM | POA: Diagnosis not present

## 2023-10-29 MED ORDER — ARIPIPRAZOLE 2 MG PO TABS: 2.0000 mg | ORAL_TABLET | Freq: Every morning | ORAL | 1 refills | Status: DC

## 2023-10-29 NOTE — Progress Notes (Signed)
 [facility]  Patient ID: [name],  MRN: [id number]  PCP: Onita Rush, MD  Date of Evaluation: 10/29/2023 Time spent:25 minutes  Chief Complaint:  Chief Complaint   Other    HISTORY/CURRENT STATUS:  Her son was killed in a car wreck over the weekend.  Very difficult but she's handling it as best she can.  Cries easily.  I know I've got to grieve and a pill isn't going to fix everything.  Needs something to help w/ motivation and energy though.  The Seroquel  is very beneficial when needed for sleep.  Not having PA but is understandably overwhelmed.  She does not want to take a benzodiazepine unless absolutely necessary.    She's out of work until 11/05/2023 and wants to work to get her mind off things, but she doesn't feel like she'll be able to go back to the office at first.  Would like to work from home.    Individual Medical History/ Review of Systems: Changes? :No     Past medications for mental health diagnoses include: Adderall, Lamictal , Seroquel , Cymbalta, Tegretol, Buspar, Lexapro , Zyprexa    Allergies: Zyprexa  [olanzapine ]  Current Medications:  Current Outpatient Medications:    ARIPiprazole  (ABILIFY ) 2 MG tablet, Take 1 tablet (2 mg total) by mouth in the morning., Disp: 30 tablet, Rfl: 1   [START ON 12/23/2023] amphetamine -dextroamphetamine  (ADDERALL) 30 MG tablet, Take 1 tablet by mouth 2 (two) times daily., Disp: 60 tablet, Rfl: 0   [START ON 11/25/2023] amphetamine -dextroamphetamine  (ADDERALL) 30 MG tablet, Take 1 tablet by mouth 2 (two) times daily., Disp: 60 tablet, Rfl: 0   amphetamine -dextroamphetamine  (ADDERALL) 30 MG tablet, Take 1 tablet by mouth 2 (two) times daily., Disp: 60 tablet, Rfl: 0   atenolol (TENORMIN) 25 MG tablet, Take 25 mg by mouth daily., Disp: , Rfl:    escitalopram  (LEXAPRO ) 20 MG tablet, Take 1.5 tablets (30 mg total) by mouth daily., Disp: 135 tablet, Rfl: 1   etonogestrel-ethinyl estradiol (NUVARING) 0.12-0.015  MG/24HR vaginal ring, Place 1 each vaginally every 28 (twenty-eight) days. Insert vaginally and leave in place for 3 consecutive weeks, then remove for 1 week., Disp: , Rfl:    ibuprofen (ADVIL,MOTRIN) 800 MG tablet, , Disp: , Rfl:    lamoTRIgine  (LAMICTAL ) 100 MG tablet, Take 2 tablets (200 mg total) by mouth 2 (two) times daily., Disp: 360 tablet, Rfl: 1   Liraglutide -Weight Management 18 MG/3ML SOPN, Inject into the skin daily. (Patient not taking: Reported on 05/07/2021), Disp: , Rfl:    QUEtiapine  (SEROQUEL ) 100 MG tablet, Take 1 tablet (100 mg total) by mouth at bedtime as needed., Disp: 90 tablet, Rfl: 1   Vitamin D , Ergocalciferol , (DRISDOL ) 1.25 MG (50000 UNIT) CAPS capsule, Take 1 capsule (50,000 Units total) by mouth every 7 (seven) days., Disp: 12 capsule, Rfl: 0   WEGOVY 1.7 MG/0.75ML SOAJ, SMARTSIG:1.7 Milligram(s) SUB-Q Once a Week (Patient not taking: Reported on 07/08/2023), Disp: , Rfl:    WEGOVY 2.4 MG/0.75ML SOAJ, Inject 2.4 mg into the skin once a week., Disp: , Rfl:  Medication Side Effects: none  Family Medical/ Social History: Changes?   no  MENTAL HEALTH EXAM:  There were no vitals taken for this visit.There is no height or weight on file to calculate BMI.  General Appearance: Casual, Neat, and Well Groomed  Eye Contact:  Good  Speech:  Clear and Coherent, Normal Rate, and Talkative  Volume:  Normal  Mood:  sad  Affect:  Congruent and Tearful  Thought  Process:  Goal Directed and Descriptions of Associations: Circumstantial  Orientation:  Full (Time, Place, and Person)  Thought Content: Logical   Suicidal Thoughts:  No  Homicidal Thoughts:  No  Memory:  WNL  Judgement:  Good  Insight:  Good  Psychomotor Activity:  Normal  Concentration:  Concentration: Good and Attention Span: Good  Recall:  Good  Fund of Knowledge: Good  Language: Good  Assets:  Communication Skills Desire for Improvement Financial  Resources/Insurance Housing Resilience Transportation Vocational/Educational  ADL's:  Intact  Cognition: WNL  Prognosis:  Good   DIAGNOSES:    ICD-10-CM   1. Grief at loss of child  F43.21    Z63.4     2. Bipolar disorder in remission (HCC)  F31.70     3. Attention deficit hyperactivity disorder (ADHD), combined type  F90.2     4. PTSD (post-traumatic stress disorder)  F43.10       Receiving Psychotherapy: Yes  Johnetta Bers, [facility]  RECOMMENDATIONS:  PDMD reviewed.  Last Adderall filled 10/27/2023. I provided approximately 25 minutes of face to face time during this encounter, including time spent before and after the visit in records review, medical decision making, counseling pertinent to today's visit, and charting.   My condolences in the loss of her son.  We discussed medication to help with her energy and motivation.  The Adderall helps some but does not seem to be enough.  I think she will need some help with lifting her mood in the days to come as well so I recommend Abilify .  Benefits, risk and side effects were discussed and she accepts.  She needs to work from home 5 days a week right now.  She wants to work but it is not healthy for her to be in an office setting right now anyway. She is out till 9/11 for bereavement leave.  Cont Adderall 30 mg, 1 at breakfast and 1 at lunch. Start Abilify  2 mg every morning. Cont Lexapro  30 mg qd. Continue Lamictal  100 mg, 2 po bid.  Continue Seroquel  100 mg (she only takes a little bite off it, for sleep) Rarely takes.  Continue Vitamin D  50,000 iu q week. Per PCP.  Cont psychotherapy with Johnetta Bers, [facility]. Return in 4 weeks.  Verneita Cooks, PA-C

## 2023-10-30 ENCOUNTER — Telehealth: Payer: Self-pay | Admitting: Physician Assistant

## 2023-10-30 DIAGNOSIS — Z0289 Encounter for other administrative examinations: Secondary | ICD-10-CM

## 2023-10-30 NOTE — Telephone Encounter (Signed)
 Pt paid $45 for interm leave forms. Given to [place]. Pt states they are due by 9/11. Made no promises & told her they are normally completed on Fridays.

## 2023-11-02 NOTE — Telephone Encounter (Signed)
 Noted

## 2023-11-02 NOTE — Telephone Encounter (Signed)
 Will complete request for accommodations from [place] [employer] [place] when office note completed.

## 2023-11-06 NOTE — Telephone Encounter (Signed)
 Accommodation Form completed and faxed per request to work from home 5 days a week.

## 2023-11-09 ENCOUNTER — Telehealth: Payer: Self-pay | Admitting: Physician Assistant

## 2023-11-09 NOTE — Telephone Encounter (Signed)
 Pt is wanting to talk to Verneita about half days for a month. She wants to ease her way back into work. Just got paperwork completed to work from home but thinks she needs the interaction of being in the office.

## 2023-11-10 NOTE — Telephone Encounter (Signed)
 Received fax from [place] [employer] [place] concerning pt working from home, 3 questions asked on asking why she physically can not come to the office, asking the benefit from working from home, and asking what else they can do for pt to come into office.   Will discuss further with Verneita before replying.

## 2023-11-10 NOTE — Telephone Encounter (Signed)
 Discussed with Verneita after her phone call to pt.

## 2023-11-13 NOTE — Telephone Encounter (Signed)
 Completed questions regarding working from home and also the latest FMLA. Faxed to [employer]

## 2023-11-16 ENCOUNTER — Telehealth: Payer: Self-pay | Admitting: Physician Assistant

## 2023-11-16 NOTE — Telephone Encounter (Signed)
 Been sobbing all weekend. Will Teresa increase abilify  or lamictal . She is just really struggling. She is staying active, has a lot of support but wants to see if Verneita would adjust meds.     [facility] [address] [place]

## 2023-11-16 NOTE — Telephone Encounter (Signed)
 LV 9/4:  Cont Adderall 30 mg, 1 at breakfast and 1 at lunch. Start Abilify  2 mg every morning. Cont Lexapro  30 mg qd. Continue Lamictal  100 mg, 2 po bid.  Continue Seroquel  100 mg (she only takes a little bite off it, for sleep) Rarely takes.  Continue Vitamin D  50,000 iu q week. Per PCP.  Cont psychotherapy with Johnetta Bers, [facility]. Return in 4 weeks.  Has FU 10/13. She reports sobbing all weekend. Asking if you can increase medication.   [facility]

## 2023-11-17 MED ORDER — ARIPIPRAZOLE 5 MG PO TABS: 5.0000 mg | ORAL_TABLET | Freq: Every day | ORAL | 0 refills | Status: DC

## 2023-11-17 NOTE — Telephone Encounter (Signed)
 Pt notified to increase Abilify . Script sent. She has an appt with Johnetta tomorrow at 12:30.

## 2023-11-17 NOTE — Telephone Encounter (Signed)
 Noted

## 2023-11-17 NOTE — Telephone Encounter (Signed)
 Increase Abilify  to 5 mg q am.  If she hasn't gotten in touch with Johnetta since her son's funeral, ask her to contact her asap for an appt.

## 2023-11-20 ENCOUNTER — Telehealth: Payer: Self-pay | Admitting: Physician Assistant

## 2023-11-20 NOTE — Telephone Encounter (Signed)
 Discussed with patient and she will discuss with Verneita at her appt when has more time to explore options.

## 2023-11-20 NOTE — Telephone Encounter (Signed)
 Pt called reporting taking abilify  since 9/4 last visit. In a week gained 5 lbs. Can't stop eating. Increase dose from 2 mg to 5 mg gotten worse. Very forgetful. Asking to get off abilify . Moved apt up to 9/30. Please advise if need apt. Pt contact [phone]

## 2023-11-24 ENCOUNTER — Ambulatory Visit: Admitting: Physician Assistant

## 2023-11-24 ENCOUNTER — Encounter: Payer: Self-pay | Admitting: Physician Assistant

## 2023-11-24 DIAGNOSIS — F4321 Adjustment disorder with depressed mood: Secondary | ICD-10-CM

## 2023-11-24 DIAGNOSIS — Z634 Disappearance and death of family member: Secondary | ICD-10-CM

## 2023-11-24 DIAGNOSIS — F902 Attention-deficit hyperactivity disorder, combined type: Secondary | ICD-10-CM | POA: Diagnosis not present

## 2023-11-24 DIAGNOSIS — F3170 Bipolar disorder, currently in remission, most recent episode unspecified: Secondary | ICD-10-CM | POA: Diagnosis not present

## 2023-11-24 MED ORDER — AUVELITY 45-105 MG PO TBCR: 1.0000 | EXTENDED_RELEASE_TABLET | Freq: Two times a day (BID) | ORAL | 1 refills | Status: DC

## 2023-11-24 MED ORDER — AMPHETAMINE-DEXTROAMPHETAMINE 30 MG PO TABS: 30.0000 mg | ORAL_TABLET | Freq: Two times a day (BID) | ORAL | 0 refills | Status: DC
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
## 2023-11-26 ENCOUNTER — Telehealth: Payer: Self-pay | Admitting: Physician Assistant

## 2023-11-26 DIAGNOSIS — Z0289 Encounter for other administrative examinations: Secondary | ICD-10-CM

## 2023-11-26 NOTE — Telephone Encounter (Signed)
 PT called and paid for her STD paperwork today.

## 2023-11-27 ENCOUNTER — Telehealth: Payer: Self-pay

## 2023-11-27 NOTE — Telephone Encounter (Signed)
 Completed FMLA for [employer], faxed per request

## 2023-12-07 ENCOUNTER — Encounter: Payer: Self-pay | Admitting: Physician Assistant

## 2023-12-07 ENCOUNTER — Ambulatory Visit: Admitting: Physician Assistant

## 2023-12-07 DIAGNOSIS — F4321 Adjustment disorder with depressed mood: Secondary | ICD-10-CM

## 2023-12-07 DIAGNOSIS — F3170 Bipolar disorder, currently in remission, most recent episode unspecified: Secondary | ICD-10-CM | POA: Diagnosis not present

## 2023-12-07 DIAGNOSIS — Z634 Disappearance and death of family member: Secondary | ICD-10-CM

## 2023-12-07 DIAGNOSIS — F902 Attention-deficit hyperactivity disorder, combined type: Secondary | ICD-10-CM

## 2023-12-07 DIAGNOSIS — F4323 Adjustment disorder with mixed anxiety and depressed mood: Secondary | ICD-10-CM | POA: Diagnosis not present
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
## 2023-12-13 ENCOUNTER — Encounter: Payer: Self-pay | Admitting: Physician Assistant

## 2023-12-13 MED ORDER — QUETIAPINE FUMARATE 100 MG PO TABS: 150.0000 mg | ORAL_TABLET | Freq: Every evening | ORAL | Status: AC | PRN

## 2023-12-19 ENCOUNTER — Other Ambulatory Visit: Payer: Self-pay | Admitting: Physician Assistant

## 2023-12-19 DIAGNOSIS — F902 Attention-deficit hyperactivity disorder, combined type: Secondary | ICD-10-CM

## 2024-01-06 ENCOUNTER — Encounter: Payer: Self-pay | Admitting: Physician Assistant

## 2024-01-06 ENCOUNTER — Ambulatory Visit: Admitting: Physician Assistant

## 2024-01-06 ENCOUNTER — Telehealth: Payer: Self-pay | Admitting: Physician Assistant

## 2024-01-06 DIAGNOSIS — F319 Bipolar disorder, unspecified: Secondary | ICD-10-CM | POA: Diagnosis not present

## 2024-01-06 DIAGNOSIS — F902 Attention-deficit hyperactivity disorder, combined type: Secondary | ICD-10-CM | POA: Diagnosis not present

## 2024-01-06 DIAGNOSIS — F4321 Adjustment disorder with depressed mood: Secondary | ICD-10-CM | POA: Diagnosis not present

## 2024-01-06 DIAGNOSIS — Z0289 Encounter for other administrative examinations: Secondary | ICD-10-CM

## 2024-01-06 DIAGNOSIS — Z634 Disappearance and death of family member: Secondary | ICD-10-CM | POA: Diagnosis not present

## 2024-01-06 MED ORDER — ESCITALOPRAM OXALATE 20 MG PO TABS: 40.0000 mg | ORAL_TABLET | Freq: Every day | ORAL | 1 refills | Status: AC

## 2024-01-06 NOTE — Telephone Encounter (Signed)
 Received ppk for intermittent [facility]. Paid $45. Given to Traci. Pt states to pull info from 11/13/23 note and to fax to [phone] Attn: Betty Glatter MA Intake Team

## 2024-01-06 NOTE — Progress Notes (Addendum)
 [facility]  Patient ID: [name],  MRN: [id number]  PCP: Onita Rush, MD  Date of Evaluation: 01/06/2024 Time spent:20 minutes  Chief Complaint:  Chief Complaint   Follow-up    HISTORY/CURRENT STATUS: For routine f/u.  Will go back to work next week. Feels like she's ready.  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  She is understandably still emotional and grieving the loss of her son who died in a car accident in 10/27/2023.  She is trying to stay busy to help keep her mind off the grief.  She lets herself cry when she needs to.  She is seeing her therapist weekly.  She sleeps well.  The Auvelity has been very helpful for the depression, but she still feels down at times, and it is not just the grief.  She does not allow herself to stay in bed.  Appetite is normal and weight is stable.  Gets anxious sometimes but no full-blown panic attacks.  ADLs and personal hygiene are normal.  The Adderall is still effective. States that attention is good without easy distractibility.  Able to focus on things and finish tasks to completion.   No mania, delirium, or psychosis.  No suicidal or homicidal thoughts.  She will be seeing Dr. Onita for routine physical next month.  Individual Medical History/ Review of Systems: Changes? :No     Past medications for mental health diagnoses include: Adderall, Lamictal , Seroquel , Cymbalta, Tegretol, Buspar, Lexapro , Zyprexa , Wellbutrin    Allergies: Zyprexa  [olanzapine ]  Current Medications:  Current Outpatient Medications:    amphetamine -dextroamphetamine  (ADDERALL) 30 MG tablet, Take 1 tablet by mouth 2 (two) times daily., Disp: 60 tablet, Rfl: 0   amphetamine -dextroamphetamine  (ADDERALL) 30 MG tablet, Take 1 tablet by mouth 2 (two) times daily., Disp: 60 tablet, Rfl: 0   amphetamine -dextroamphetamine  (ADDERALL) 30 MG tablet, Take 1 tablet by mouth 2 (two) times daily., Disp: 60 tablet, Rfl: 0   atenolol (TENORMIN) 25 MG  tablet, Take 25 mg by mouth daily., Disp: , Rfl:    Dextromethorphan-buPROPion  ER (AUVELITY) 45-105 MG TBCR, Take 1 tablet by mouth 2 (two) times daily., Disp: 60 tablet, Rfl: 1   etonogestrel-ethinyl estradiol (NUVARING) 0.12-0.015 MG/24HR vaginal ring, Place 1 each vaginally every 28 (twenty-eight) days. Insert vaginally and leave in place for 3 consecutive weeks, then remove for 1 week., Disp: , Rfl:    lamoTRIgine  (LAMICTAL ) 100 MG tablet, Take 2 tablets (200 mg total) by mouth 2 (two) times daily., Disp: 360 tablet, Rfl: 1   QUEtiapine  (SEROQUEL ) 100 MG tablet, Take 1.5 tablets (150 mg total) by mouth at bedtime as needed., Disp: , Rfl:    Vitamin D , Ergocalciferol , (DRISDOL ) 1.25 MG (50000 UNIT) CAPS capsule, Take 1 capsule (50,000 Units total) by mouth every 7 (seven) days., Disp: 12 capsule, Rfl: 0   WEGOVY 2.4 MG/0.75ML SOAJ, Inject 2.4 mg into the skin once a week., Disp: , Rfl:    escitalopram  (LEXAPRO ) 20 MG tablet, Take 2 tablets (40 mg total) by mouth daily., Disp: 180 tablet, Rfl: 1   ibuprofen (ADVIL,MOTRIN) 800 MG tablet, , Disp: , Rfl:    Liraglutide -Weight Management 18 MG/3ML SOPN, Inject into the skin daily. (Patient not taking: Reported on 05/07/2021), Disp: , Rfl:    WEGOVY 1.7 MG/0.75ML SOAJ, SMARTSIG:1.7 Milligram(s) SUB-Q Once a Week (Patient not taking: Reported on 07/08/2023), Disp: , Rfl:  Medication Side Effects: none  Family Medical/ Social History: Changes?   no  MENTAL HEALTH EXAM:  There were no vitals taken for this visit.There is no height or weight on file to calculate BMI.  General Appearance: Casual, Neat, and Well Groomed  Eye Contact:  Good  Speech:  Clear and Coherent, Normal Rate, and Talkative  Volume:  Normal  Mood:  sad  Affect:  Tearful but consolable  Thought Process:  Goal Directed and Descriptions of Associations: Circumstantial  Orientation:  Full (Time, Place, and Person)  Thought Content: Logical   Suicidal Thoughts:  No  Homicidal  Thoughts:  No  Memory:  WNL  Judgement:  Good  Insight:  Good  Psychomotor Activity:  Normal  Concentration:  Concentration: Good and Attention Span: Good  Recall:  Good  Fund of Knowledge: Good  Language: Good  Assets:  Communication Skills Desire for Improvement Financial Resources/Insurance Housing Physical Health Resilience Social Support Transportation Vocational/Educational  ADL's:  Intact  Cognition: WNL  Prognosis:  Good   DIAGNOSES:    ICD-10-CM   1. Grief at loss of child  F43.21    Z63.4     2. Bipolar affective disorder, remission status unspecified (HCC)  F31.9 escitalopram  (LEXAPRO ) 20 MG tablet    3. Attention deficit hyperactivity disorder (ADHD), combined type  F90.2      Receiving Psychotherapy: Yes  Carson Sarvis, [facility] every 1-2 weeks  RECOMMENDATIONS:  PDMD reviewed.  Last Adderall filled 12/22/2023. I provided approximately 20 minutes of face to face time during this encounter, including time spent before and after the visit in records review, medical decision making, counseling pertinent to today's visit, and charting.   We discussed the anxiety and depressive symptoms.  I recommend increasing the Lexapro .  She may need extra help with serotonin regulation during the holidays.  She would like to increase it.  She needs to work from home 5 days a week. Starting 01/12/2024 for 90 days and then we will reevaluate as to whether she can go back to the office or not.  Cont Adderall 30 mg, 1 at breakfast and 1 at lunch. Continue Auvelity 45-105 mg, 1 p.o. twice daily. Increase Lexapro  20 mg, to 2 daily. Continue Lamictal  100 mg, 2 po bid.  Continue Seroquel  100 mg   1.5 at bedtime.  Continue Vitamin D  50,000 iu q week. Per PCP.  Cont psychotherapy with Johnetta Bers, [facility]. Return in 6 weeks.  Verneita Cooks, PA-C

## 2024-01-07 NOTE — Telephone Encounter (Signed)
 Paperwork completed to be able to work from home for 90 days plus intermittent fmla 2 days/episodes per month with apts for medication management and psychotherapy scheduled and non scheduled.   Faxed to [employer] [phone]

## 2024-01-10 ENCOUNTER — Other Ambulatory Visit: Payer: Self-pay | Admitting: Physician Assistant

## 2024-01-16 ENCOUNTER — Other Ambulatory Visit: Payer: Self-pay | Admitting: Physician Assistant

## 2024-01-16 DIAGNOSIS — F319 Bipolar disorder, unspecified: Secondary | ICD-10-CM

## 2024-02-08 ENCOUNTER — Other Ambulatory Visit: Payer: Self-pay | Admitting: Physician Assistant

## 2024-02-12 ENCOUNTER — Telehealth: Payer: Self-pay | Admitting: Physician Assistant

## 2024-02-12 NOTE — Telephone Encounter (Signed)
 Pt called requesting Adderall Rx 30 mg 2/d [facility] [place]   Apt 12/23

## 2024-02-12 NOTE — Telephone Encounter (Signed)
 LF 11/24, due 12/23

## 2024-02-15 DIAGNOSIS — I10 Essential (primary) hypertension: Secondary | ICD-10-CM | POA: Diagnosis not present

## 2024-02-15 DIAGNOSIS — Z23 Encounter for immunization: Secondary | ICD-10-CM | POA: Diagnosis not present

## 2024-02-16 ENCOUNTER — Encounter: Payer: Self-pay | Admitting: Physician Assistant

## 2024-02-16 ENCOUNTER — Ambulatory Visit: Admitting: Physician Assistant

## 2024-02-16 ENCOUNTER — Telehealth: Payer: Self-pay | Admitting: Physician Assistant

## 2024-02-16 ENCOUNTER — Other Ambulatory Visit: Payer: Self-pay

## 2024-02-16 DIAGNOSIS — Z634 Disappearance and death of family member: Secondary | ICD-10-CM | POA: Diagnosis not present

## 2024-02-16 DIAGNOSIS — F3170 Bipolar disorder, currently in remission, most recent episode unspecified: Secondary | ICD-10-CM | POA: Diagnosis not present

## 2024-02-16 DIAGNOSIS — F4321 Adjustment disorder with depressed mood: Secondary | ICD-10-CM | POA: Diagnosis not present

## 2024-02-16 DIAGNOSIS — F902 Attention-deficit hyperactivity disorder, combined type: Secondary | ICD-10-CM

## 2024-02-16 MED ORDER — AMPHETAMINE-DEXTROAMPHETAMINE 30 MG PO TABS: 30.0000 mg | ORAL_TABLET | Freq: Two times a day (BID) | ORAL | 0 refills | Status: AC

## 2024-02-16 MED ORDER — AMPHETAMINE-DEXTROAMPHETAMINE 30 MG PO TABS: 30.0000 mg | ORAL_TABLET | Freq: Two times a day (BID) | ORAL | 0 refills | Status: DC

## 2024-02-16 NOTE — Telephone Encounter (Signed)
 Has appt today

## 2024-02-16 NOTE — Telephone Encounter (Signed)
 [facility] Please send to [facility].

## 2024-02-16 NOTE — Progress Notes (Signed)
 "     [facility]  Patient ID: [name],  MRN: [id number]  PCP: Onita Rush, MD  Date of Evaluation: 02/16/2024 Time spent:20 minutes  Chief Complaint:  Chief Complaint   Follow-up    HISTORY/CURRENT STATUS: For routine f/u.  [name] in doing as well as to be expected in grieving the loss of her son.  This is the first Christmas since he passed away. She leans on her faith and support of family and friends to help as they share the grief together.   She feels like her meds are working as intended.  Energy and motivation are good.  Work is going well. Still working from home and feels good about that for now.  Does get together w/ friends and goes to the office sometimes.  Not isolating.  No feelings of hopelessness.  Sleeps well most of the time. ADLs and personal hygiene are normal.   Denies any changes in concentration, making decisions, or remembering things.  Appetite has not changed.  Anxiety is controlled.  No mania, delirium, AH/VH.  No SI/HI.  Individual Medical History/ Review of Systems: Changes? :No     Past medications for mental health diagnoses include: Adderall, Lamictal , Seroquel , Cymbalta, Tegretol, Buspar, Lexapro , Zyprexa , Wellbutrin    Allergies: Zyprexa  [olanzapine ]  Current Medications:  Current Outpatient Medications:    atenolol (TENORMIN) 25 MG tablet, Take 25 mg by mouth daily., Disp: , Rfl:    AUVELITY  45-105 MG TBCR, TAKE 1 TABLET BY MOUTH TWICE A DAY, Disp: 60 tablet, Rfl: 1   escitalopram  (LEXAPRO ) 20 MG tablet, Take 2 tablets (40 mg total) by mouth daily., Disp: 180 tablet, Rfl: 1   etonogestrel-ethinyl estradiol (NUVARING) 0.12-0.015 MG/24HR vaginal ring, Place 1 each vaginally every 28 (twenty-eight) days. Insert vaginally and leave in place for 3 consecutive weeks, then remove for 1 week., Disp: , Rfl:    QUEtiapine  (SEROQUEL ) 100 MG tablet, Take 1.5 tablets (150 mg total) by mouth at bedtime as needed., Disp: , Rfl:    WEGOVY 2.4  MG/0.75ML SOAJ, Inject 2.4 mg into the skin once a week., Disp: , Rfl:    [START ON 03/16/2024] amphetamine -dextroamphetamine  (ADDERALL) 30 MG tablet, Take 1 tablet by mouth 2 (two) times daily., Disp: 60 tablet, Rfl: 0   [START ON 04/14/2024] amphetamine -dextroamphetamine  (ADDERALL) 30 MG tablet, Take 1 tablet by mouth 2 (two) times daily., Disp: 60 tablet, Rfl: 0   amphetamine -dextroamphetamine  (ADDERALL) 30 MG tablet, Take 1 tablet by mouth 2 (two) times daily., Disp: 60 tablet, Rfl: 0   ibuprofen (ADVIL,MOTRIN) 800 MG tablet, , Disp: , Rfl:    lamoTRIgine  (LAMICTAL ) 100 MG tablet, TAKE 2 TABLETS BY MOUTH 2 TIMES DAILY., Disp: 360 tablet, Rfl: 0   Liraglutide -Weight Management 18 MG/3ML SOPN, Inject into the skin daily. (Patient not taking: Reported on 05/07/2021), Disp: , Rfl:    Vitamin D , Ergocalciferol , (DRISDOL ) 1.25 MG (50000 UNIT) CAPS capsule, Take 1 capsule (50,000 Units total) by mouth every 7 (seven) days., Disp: 12 capsule, Rfl: 0   WEGOVY 1.7 MG/0.75ML SOAJ, SMARTSIG:1.7 Milligram(s) SUB-Q Once a Week (Patient not taking: Reported on 07/08/2023), Disp: , Rfl:  Medication Side Effects: none  Family Medical/ Social History: Changes?   no  MENTAL HEALTH EXAM:  There were no vitals taken for this visit.There is no height or weight on file to calculate BMI.  General Appearance: Casual, Neat, and Well Groomed  Eye Contact:  Good  Speech:  Clear and Coherent, Normal Rate, and Talkative  Volume:  Normal  Mood:  sad  Affect:  Congruent  Thought Process:  Goal Directed and Descriptions of Associations: Circumstantial  Orientation:  Full (Time, Place, and Person)  Thought Content: Logical   Suicidal Thoughts:  No  Homicidal Thoughts:  No  Memory:  WNL  Judgement:  Good  Insight:  Good  Psychomotor Activity:  Normal  Concentration:  Concentration: Good and Attention Span: Good  Recall:  Good  Fund of Knowledge: Good  Language: Good  Assets:  Communication Skills Desire for  Improvement Financial Resources/Insurance Housing Physical Health Resilience Social Support Transportation Vocational/Educational  ADL's:  Intact  Cognition: WNL  Prognosis:  Good   DIAGNOSES:    ICD-10-CM   1. Bipolar disorder in remission  F31.70     2. Attention deficit hyperactivity disorder (ADHD), combined type  F90.2 amphetamine -dextroamphetamine  (ADDERALL) 30 MG tablet    amphetamine -dextroamphetamine  (ADDERALL) 30 MG tablet    DISCONTINUED: amphetamine -dextroamphetamine  (ADDERALL) 30 MG tablet    3. Grief at loss of child  F43.21    Z63.4      Receiving Psychotherapy: Yes  Carson Sarvis, [facility] every 1-2 weeks  RECOMMENDATIONS:  PDMD reviewed.  Last Adderall filled 01/18/2024. I provided approximately  20 minutes of face to face time during this encounter, including time spent before and after the visit in records review, medical decision making, counseling pertinent to today's visit, and charting.   She's doing well w/ her meds.  Is still in counseling and will continue.  Continue to work from home 5 days a week. Starting 01/12/2024 and will reevaluate at the next OV as to whether she can go back to the office or not.  Cont Adderall 30 mg, 1 at breakfast and 1 at lunch. Continue Auvelity  45-105 mg, 1 p.o. twice daily. Continue Lexapro  20 mg, to 2 daily. Continue Lamictal  100 mg, 2 po bid.  Continue Seroquel  100 mg   1.5 at bedtime.  Continue Vitamin D  50,000 iu q week. Per PCP.  Cont psychotherapy with Johnetta Bers, [facility]. Return in 6 weeks.    Verneita Cooks, PA-C  "

## 2024-02-16 NOTE — Telephone Encounter (Signed)
 Canceled at [facility] and repended to [facility] in [place].

## 2024-03-03 ENCOUNTER — Other Ambulatory Visit: Payer: Self-pay | Admitting: Physician Assistant

## 2024-03-30 ENCOUNTER — Encounter: Payer: Self-pay | Admitting: Physician Assistant

## 2024-03-30 ENCOUNTER — Ambulatory Visit: Admitting: Physician Assistant

## 2024-03-30 ENCOUNTER — Telehealth: Payer: Self-pay | Admitting: Physician Assistant

## 2024-03-30 DIAGNOSIS — F4323 Adjustment disorder with mixed anxiety and depressed mood: Secondary | ICD-10-CM

## 2024-03-30 DIAGNOSIS — F3170 Bipolar disorder, currently in remission, most recent episode unspecified: Secondary | ICD-10-CM | POA: Diagnosis not present

## 2024-03-30 DIAGNOSIS — F902 Attention-deficit hyperactivity disorder, combined type: Secondary | ICD-10-CM

## 2024-03-30 DIAGNOSIS — Z634 Disappearance and death of family member: Secondary | ICD-10-CM

## 2024-03-30 DIAGNOSIS — F4321 Adjustment disorder with depressed mood: Secondary | ICD-10-CM

## 2024-03-30 NOTE — Telephone Encounter (Signed)
 Pt brought in interm. [facility] leave recertification form. No charge per [facility]. Given to [facility]

## 2024-03-30 NOTE — Progress Notes (Signed)
 "     [facility]  Patient ID: [name],  MRN: [id number]  PCP: Onita Rush, MD  Date of Evaluation: 03/30/2024 Time spent:25 minutes  Chief Complaint:  Chief Complaint   Depression; ADHD; Follow-up    HISTORY/CURRENT STATUS: For routine f/u.  She's using [employer] to help her take care of the plants that were sent when Wyatt died.  It's therapuetic and fun learning.  Energy and motivation are good.  Work is going ok. No extreme sadness, tearfulness, or feelings of hopelessness.  Sleeps ok.  ADLs and personal hygiene are normal.   Adderall is working well.  Denies any changes in concentration, making decisions, or remembering things.  Appetite has not changed.  Weight is stable.  No mania, delirium, AH/VH.  No SI/HI.  Individual Medical History/ Review of Systems: Changes? :No     Past medications for mental health diagnoses include: Adderall, Lamictal , Seroquel , Cymbalta, Tegretol, Buspar, Lexapro , Zyprexa , Wellbutrin    Allergies: Zyprexa  [olanzapine ]  Current Medications:  Current Outpatient Medications:    amphetamine -dextroamphetamine  (ADDERALL) 30 MG tablet, Take 1 tablet by mouth 2 (two) times daily., Disp: 60 tablet, Rfl: 0   [START ON 04/14/2024] amphetamine -dextroamphetamine  (ADDERALL) 30 MG tablet, Take 1 tablet by mouth 2 (two) times daily., Disp: 60 tablet, Rfl: 0   amphetamine -dextroamphetamine  (ADDERALL) 30 MG tablet, Take 1 tablet by mouth 2 (two) times daily., Disp: 60 tablet, Rfl: 0   atenolol (TENORMIN) 25 MG tablet, Take 25 mg by mouth daily., Disp: , Rfl:    Dextromethorphan-buPROPion  ER (AUVELITY ) 45-105 MG TBCR, TAKE 1 TABLET BY MOUTH TWICE A DAY, Disp: 180 tablet, Rfl: 0   escitalopram  (LEXAPRO ) 20 MG tablet, Take 2 tablets (40 mg total) by mouth daily., Disp: 180 tablet, Rfl: 1   etonogestrel-ethinyl estradiol (NUVARING) 0.12-0.015 MG/24HR vaginal ring, Place 1 each vaginally every 28 (twenty-eight) days. Insert vaginally and leave in place  for 3 consecutive weeks, then remove for 1 week., Disp: , Rfl:    lamoTRIgine  (LAMICTAL ) 100 MG tablet, TAKE 2 TABLETS BY MOUTH 2 TIMES DAILY., Disp: 360 tablet, Rfl: 0   QUEtiapine  (SEROQUEL ) 100 MG tablet, Take 1.5 tablets (150 mg total) by mouth at bedtime as needed., Disp: , Rfl:    Vitamin D , Ergocalciferol , (DRISDOL ) 1.25 MG (50000 UNIT) CAPS capsule, Take 1 capsule (50,000 Units total) by mouth every 7 (seven) days., Disp: 12 capsule, Rfl: 0   WEGOVY 2.4 MG/0.75ML SOAJ, Inject 2.4 mg into the skin once a week., Disp: , Rfl:    ibuprofen (ADVIL,MOTRIN) 800 MG tablet, , Disp: , Rfl:    Liraglutide -Weight Management 18 MG/3ML SOPN, Inject into the skin daily. (Patient not taking: Reported on 05/07/2021), Disp: , Rfl:    WEGOVY 1.7 MG/0.75ML SOAJ, SMARTSIG:1.7 Milligram(s) SUB-Q Once a Week (Patient not taking: Reported on 07/08/2023), Disp: , Rfl:  Medication Side Effects: none  Family Medical/ Social History: Changes?   no  MENTAL HEALTH EXAM:  There were no vitals taken for this visit.There is no height or weight on file to calculate BMI.  General Appearance: Casual, Neat, and Well Groomed  Eye Contact:  Good  Speech:  Clear and Coherent, Normal Rate, and Talkative  Volume:  Normal  Mood:  Euthymic  Affect:  Congruent  Thought Process:  Goal Directed and Descriptions of Associations: Circumstantial  Orientation:  Full (Time, Place, and Person)  Thought Content: Logical   Suicidal Thoughts:  No  Homicidal Thoughts:  No  Memory:  WNL  Judgement:  Good  Insight:  Good  Psychomotor Activity:  Normal  Concentration:  Concentration: Good and Attention Span: Good  Recall:  Good  Fund of Knowledge: Good  Language: Good  Assets:  Communication Skills Desire for Improvement Financial Resources/Insurance Housing Resilience Social Support Transportation Vocational/Educational  ADL's:  Intact  Cognition: WNL  Prognosis:  Good   DIAGNOSES:    ICD-10-CM   1. Bipolar disorder in  remission  F31.70     2. Situational mixed anxiety and depressive disorder  F43.23     3. Attention deficit hyperactivity disorder (ADHD), combined type  F90.2     4. Grief at loss of child  F43.21    Z63.4      Receiving Psychotherapy: Yes  Carson Sarvis, [facility] every 1-2 weeks  RECOMMENDATIONS:  PDMD reviewed.  Last Adderall filled 03/16/2024. I provided approximately 25 minutes of face to face time during this encounter, including time spent before and after the visit in records review, medical decision making, counseling pertinent to today's visit, and charting.   Even though she's still grieving, she's doing well. No med changes.   Still needs to work from home a few days a week.  She's going in a few days a week just to make herself get out.   Cont Adderall 30 mg, 1 at breakfast and 1 at lunch. Continue Auvelity  45-105 mg, 1 p.o. twice daily. Continue Lexapro  20 mg, to 2 daily. Continue Lamictal  100 mg, 2 po bid.  Continue Seroquel  100 mg   1.5 at bedtime.  Continue Vitamin D  50,000 iu q week. Per PCP.  Cont psychotherapy with Johnetta Bers, [facility]. Return in 6 weeks.    Verneita Cooks, PA-C  "

## 2024-05-12 ENCOUNTER — Ambulatory Visit: Admitting: Physician Assistant
